# Patient Record
Sex: Female | Born: 1955 | Race: White | Hispanic: No | Marital: Married | Smoking: Never smoker
Health system: Southern US, Community
[De-identification: ages and names within clinical notes are randomized; demographics above are authoritative.]

## PROBLEM LIST (undated history)

## (undated) DIAGNOSIS — E785 Hyperlipidemia, unspecified: Secondary | ICD-10-CM

## (undated) DIAGNOSIS — I00 Rheumatic fever without heart involvement: Secondary | ICD-10-CM

## (undated) DIAGNOSIS — I471 Supraventricular tachycardia, unspecified: Secondary | ICD-10-CM

## (undated) DIAGNOSIS — R002 Palpitations: Secondary | ICD-10-CM

## (undated) DIAGNOSIS — F419 Anxiety disorder, unspecified: Secondary | ICD-10-CM

## (undated) DIAGNOSIS — I4891 Unspecified atrial fibrillation: Secondary | ICD-10-CM

## (undated) HISTORY — DX: Anxiety disorder, unspecified: F41.9

## (undated) HISTORY — DX: Rheumatic fever without heart involvement: I00

## (undated) HISTORY — PX: OTHER SURGICAL HISTORY: SHX169

## (undated) HISTORY — DX: Supraventricular tachycardia: I47.1

## (undated) HISTORY — DX: Supraventricular tachycardia, unspecified: I47.10

## (undated) HISTORY — DX: Unspecified atrial fibrillation: I48.91

## (undated) HISTORY — DX: Hyperlipidemia, unspecified: E78.5

## (undated) HISTORY — DX: Palpitations: R00.2

---

## 1998-11-21 ENCOUNTER — Encounter: Admission: RE | Admit: 1998-11-21 | Discharge: 1999-02-19 | Payer: Self-pay | Admitting: Anesthesiology

## 1999-01-07 ENCOUNTER — Ambulatory Visit (HOSPITAL_COMMUNITY): Admission: RE | Admit: 1999-01-07 | Discharge: 1999-01-07 | Payer: Self-pay | Admitting: Neurosurgery

## 1999-01-07 ENCOUNTER — Encounter: Payer: Self-pay | Admitting: Neurosurgery

## 1999-02-21 ENCOUNTER — Ambulatory Visit (HOSPITAL_COMMUNITY): Admission: RE | Admit: 1999-02-21 | Discharge: 1999-02-21 | Payer: Self-pay | Admitting: Neurosurgery

## 1999-02-21 ENCOUNTER — Encounter: Payer: Self-pay | Admitting: Neurosurgery

## 2001-01-13 ENCOUNTER — Other Ambulatory Visit: Admission: RE | Admit: 2001-01-13 | Discharge: 2001-01-13 | Payer: Self-pay | Admitting: Obstetrics and Gynecology

## 2002-01-13 ENCOUNTER — Other Ambulatory Visit: Admission: RE | Admit: 2002-01-13 | Discharge: 2002-01-13 | Payer: Self-pay | Admitting: Obstetrics and Gynecology

## 2003-02-02 ENCOUNTER — Other Ambulatory Visit: Admission: RE | Admit: 2003-02-02 | Discharge: 2003-02-02 | Payer: Self-pay | Admitting: Obstetrics and Gynecology

## 2004-03-16 ENCOUNTER — Other Ambulatory Visit: Admission: RE | Admit: 2004-03-16 | Discharge: 2004-03-16 | Payer: Self-pay | Admitting: Obstetrics and Gynecology

## 2005-04-04 ENCOUNTER — Ambulatory Visit: Payer: Self-pay | Admitting: Internal Medicine

## 2005-04-16 ENCOUNTER — Ambulatory Visit (HOSPITAL_COMMUNITY): Admission: RE | Admit: 2005-04-16 | Discharge: 2005-04-16 | Payer: Self-pay | Admitting: Internal Medicine

## 2005-04-23 ENCOUNTER — Ambulatory Visit: Payer: Self-pay | Admitting: Internal Medicine

## 2005-05-01 ENCOUNTER — Other Ambulatory Visit: Admission: RE | Admit: 2005-05-01 | Discharge: 2005-05-01 | Payer: Self-pay | Admitting: Obstetrics and Gynecology

## 2005-06-01 ENCOUNTER — Inpatient Hospital Stay (HOSPITAL_BASED_OUTPATIENT_CLINIC_OR_DEPARTMENT_OTHER): Admission: RE | Admit: 2005-06-01 | Discharge: 2005-06-01 | Payer: Self-pay | Admitting: Cardiology

## 2005-06-01 HISTORY — PX: CARDIAC CATHETERIZATION: SHX172

## 2005-06-06 ENCOUNTER — Ambulatory Visit (HOSPITAL_COMMUNITY): Admission: RE | Admit: 2005-06-06 | Discharge: 2005-06-06 | Payer: Self-pay | Admitting: Cardiology

## 2005-08-23 ENCOUNTER — Ambulatory Visit: Payer: Self-pay | Admitting: Internal Medicine

## 2006-02-06 ENCOUNTER — Ambulatory Visit: Payer: Self-pay | Admitting: Cardiology

## 2006-02-22 ENCOUNTER — Ambulatory Visit: Payer: Self-pay | Admitting: Internal Medicine

## 2006-02-28 ENCOUNTER — Ambulatory Visit (HOSPITAL_COMMUNITY): Admission: RE | Admit: 2006-02-28 | Discharge: 2006-02-28 | Payer: Self-pay | Admitting: Internal Medicine

## 2006-03-12 ENCOUNTER — Ambulatory Visit (HOSPITAL_COMMUNITY): Admission: RE | Admit: 2006-03-12 | Discharge: 2006-03-12 | Payer: Self-pay | Admitting: Internal Medicine

## 2006-03-12 ENCOUNTER — Encounter (INDEPENDENT_AMBULATORY_CARE_PROVIDER_SITE_OTHER): Payer: Self-pay | Admitting: *Deleted

## 2006-05-30 ENCOUNTER — Other Ambulatory Visit: Admission: RE | Admit: 2006-05-30 | Discharge: 2006-05-30 | Payer: Self-pay | Admitting: Family Medicine

## 2008-05-14 HISTORY — PX: US ECHOCARDIOGRAPHY: HXRAD669

## 2008-07-20 ENCOUNTER — Ambulatory Visit: Payer: Self-pay | Admitting: Internal Medicine

## 2008-07-20 ENCOUNTER — Inpatient Hospital Stay (HOSPITAL_COMMUNITY): Admission: AD | Admit: 2008-07-20 | Discharge: 2008-07-24 | Payer: Self-pay | Admitting: Cardiology

## 2008-07-21 ENCOUNTER — Encounter: Payer: Self-pay | Admitting: Internal Medicine

## 2008-11-11 ENCOUNTER — Encounter: Payer: Self-pay | Admitting: Internal Medicine

## 2008-11-12 DIAGNOSIS — J984 Other disorders of lung: Secondary | ICD-10-CM | POA: Insufficient documentation

## 2008-11-12 DIAGNOSIS — I080 Rheumatic disorders of both mitral and aortic valves: Secondary | ICD-10-CM | POA: Insufficient documentation

## 2008-11-12 DIAGNOSIS — F17200 Nicotine dependence, unspecified, uncomplicated: Secondary | ICD-10-CM | POA: Insufficient documentation

## 2008-11-12 DIAGNOSIS — S62109A Fracture of unspecified carpal bone, unspecified wrist, initial encounter for closed fracture: Secondary | ICD-10-CM | POA: Insufficient documentation

## 2008-11-12 DIAGNOSIS — F411 Generalized anxiety disorder: Secondary | ICD-10-CM | POA: Insufficient documentation

## 2008-11-12 DIAGNOSIS — E785 Hyperlipidemia, unspecified: Secondary | ICD-10-CM | POA: Insufficient documentation

## 2008-11-12 DIAGNOSIS — R252 Cramp and spasm: Secondary | ICD-10-CM | POA: Insufficient documentation

## 2008-11-16 ENCOUNTER — Ambulatory Visit: Payer: Self-pay | Admitting: Internal Medicine

## 2008-11-16 DIAGNOSIS — I959 Hypotension, unspecified: Secondary | ICD-10-CM | POA: Insufficient documentation

## 2008-11-24 ENCOUNTER — Encounter (INDEPENDENT_AMBULATORY_CARE_PROVIDER_SITE_OTHER): Payer: Self-pay | Admitting: *Deleted

## 2008-12-24 ENCOUNTER — Encounter: Payer: Self-pay | Admitting: Cardiology

## 2008-12-30 ENCOUNTER — Ambulatory Visit: Payer: Self-pay | Admitting: Internal Medicine

## 2009-02-22 ENCOUNTER — Encounter: Payer: Self-pay | Admitting: Internal Medicine

## 2009-02-22 ENCOUNTER — Ambulatory Visit: Payer: Self-pay | Admitting: Internal Medicine

## 2009-10-28 ENCOUNTER — Ambulatory Visit: Payer: Self-pay | Admitting: Cardiology

## 2010-04-06 NOTE — Letter (Signed)
Summary: [facility] Progress Note  [facility] Progress Note   Imported By: Roderic Ovens 04/13/2009 11:01:13  _____________________________________________________________________  External Attachment:    Type:   Image     Comment:   External Document

## 2010-06-13 LAB — CBC
HCT: 41.1 % (ref 36.0–46.0)
Hemoglobin: 14.3 g/dL (ref 12.0–15.0)
MCHC: 34.8 g/dL (ref 30.0–36.0)
MCV: 93.4 fL (ref 78.0–100.0)
Platelets: 201 10*3/uL (ref 150–400)
RBC: 4.4 MIL/uL (ref 3.87–5.11)
WBC: 4.6 10*3/uL (ref 4.0–10.5)

## 2010-06-13 LAB — COMPREHENSIVE METABOLIC PANEL
AST: 20 U/L (ref 0–37)
CO2: 28 mEq/L (ref 19–32)
Calcium: 9.6 mg/dL (ref 8.4–10.5)
Chloride: 106 mEq/L (ref 96–112)
GFR calc Af Amer: >60 mL/min (ref >60)
GFR calc non Af Amer: >60 mL/min (ref >60)
Potassium: 3.9 mEq/L (ref 3.5–5.1)
Total Bilirubin: 0.8 mg/dL (ref 0.3–1.2)

## 2010-06-13 LAB — PROTIME-INR: INR: 1 (ref 0.00–1.49)

## 2010-06-13 LAB — MAGNESIUM: Magnesium: 2.3 mg/dL (ref 1.5–2.5)

## 2010-06-13 LAB — APTT: aPTT: 30 seconds (ref 24–37)
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
## 2010-07-18 NOTE — Consult Note (Signed)
Beth Schmidt, LOOSE NO.:  [id number]   MEDICAL RECORD NO.:  [id number]          PATIENT TYPE:  INP   LOCATION:  2028                         FACILITY:  [facility]   PHYSICIAN:  Duke Salvia, MD, FACCDATE OF BIRTH:  1955-04-28   DATE OF CONSULTATION:  07/21/2008  DATE OF DISCHARGE:                                 CONSULTATION   Thank you much for asking [place] to see Beth Schmidt in consultation  because of tachycardia/atrial fibrillation.   HISTORY OF PRESENT ILLNESS:  Beth Schmidt is a 55 year old woman who has  a history going back now months of recurrent increasingly frequent and  symptomatic daily tachy palpitations.  These typically occur in a very  brief repeating runs lasting 15-20 seconds and then recurring again and  it will go on all day long.  They are associated with fatigue and  shortness of breath.  There is no clear aggravating effect by caffeine.  Beta-blocker therapy was tried but was intolerable because of  aggravation of a baseline low blood pressure.  She is now admitted for  initiation of flecainide therapy.   PAST CARDIAC HISTORY:  Notable for normal left ventricular function by  echo in March 2010 with mild mitral regurgitation.  She has a history of  rheumatic fever when she was a child.  She has a history of normal  catheterization in 2007.   PAST MEDICAL HISTORY:  In addition to above is notable for:  1. Hyperlipidemia.  2. Ongoing tobacco abuse.  3. Chronic leg cramps.  4. History of lung nodules.  5. Chronic anxiety.   PAST SURGICAL HISTORY:  1. Hysterectomy.  2. Knee arthroscopy bilaterally.   CURRENT MEDICATIONS:  1. Zetia 10.  2. Flecainide 50 b.i.d.   ALLERGIES:  She is allergic to MORPHINE and CODEINE associated with  itching.   FAMILY HISTORY:  Noncontributory.   REVIEW OF SYSTEMS:  Notable as above and is otherwise negative across  all organ systems apart from early bilateral cataracts and chronic cold  intolerance.   SOCIAL HISTORY:  She is married.  She continues to smoke, although she  is not in the last 24 hours.  She uses some alcohol.  Denies use of  recreational drugs.  She has one son who is getting married in the fall.   PHYSICAL EXAMINATION:  GENERAL:  She is a middle-to-older Caucasian  female appearing her stated age of 49.  She is lying flat in bed in no  acute distress.  VITAL SIGNS:  Her blood pressure was 92/64, her pulse was 68 and  regular, her respirations were 16 and unlabored.  HEENT:  No icterus or xanthoma.  NECK:  Her neck veins were flat.  Her carotids are brisk and full  bilaterally without bruits.  There is no lymphadenopathy.  There is no  thyromegaly.  BACK:  Without kyphosis or scoliosis.  LUNGS:  Clear.  HEART:  Sounds were regular with a loud S1.  There is no significant  murmurs.  ABDOMEN:  Soft with active bowel sounds without midline pulsation.  Femoral pulses were not examined.  Distal  pulses were intact.  There is  no clubbing, cyanosis, or edema.  NEUROLOGIC:  Grossly normal.  SKIN:  Warm and dry.   LABORATORY DATA:  Electrocardiogram dated Jul 20, 2008 demonstrated  sinus rhythm at 73 with intervals of 0.16/0.18/0.39.  Her intra-atrial  conduction time was a little bit long at about 120 milliseconds.  Review  of her telemetry strips are notable for episode #2 from April 29, 2008 described as location church and activity sleeping.  Sinus rhythm  is followed by PACs and then a run of about 30 seconds of a nonsustained  supraventricular tachycardia with what appears to be a P-wave described  in the distal ST-segment.  There is some irregularity of this.  The  tachycardia then terminates only to begin again about 30 seconds later,  again irregularly.  At this point, variable morphology P-waves are not  discernible in a couple of RR pauses and then it terminates with  restoration of sinus rhythm.   Other recordings include PVCs, PACs, and  atrial couplets.   IMPRESSION:  1. Nonsustained atrial tachycardia with some degree of irregularity,      increasing over recent months, occurring daily and associated with      fatigue and palpitations.  2. Low blood pressure aggravated by her beta-blocker therapy.  3. Flecainide therapy, now for nonsustained atrial tachycardia.  4. Dizziness, question related to flecainide therapy and low blood      pressure.  5. History of rheumatic fever with mild mitral regurgitation.  6. Chronic anxiety.   DISCUSSION:  [name] has recurrent episodes of nonsustained atrial  tachycardia.  These are frequently originating from the pulmonary veins  and may be a harbinger of atrial fibrillation.  They are quite  symptomatic.  Unfortunately, her low blood pressure makes therapy with  standard beta-blockers and her calcium blockers, difficult and it is a  reasonable thing to try flecainide.  One of the concerns I have with  flecainide is that if she were to develop atrial flutter, paradoxical  tachycardia could ensue.  Normally these drugs are used concomitantly  with AV nodal blocking agents; however, in this particular case that  will aggravate her hypotension.  An alternative would be to use  propafenone, as its inherent beta-blocker might keep that from  happening.  Clearly that is not a guarantee; furthermore, the beta-  blocker may also aggravate her hypotension.   The other issue is whether the dizziness that she is describing now is  related to her flecainide; and may well be the case.  It may also be  related to her blood pressure but that is more of a chronic underlying  condition, so I suspect that it is not true.   In the event that she is able to neither tolerate the drug nor to find  them effective, catheter ablation would be a reasonable consideration.  Dr. Johney Frame and others have experienced with pulmonary vein isolation and  I would recommend that as the presumed target in  choosing a strategy.   I have also suggested that she might be able to address some of her  chronic hypotension with use of salt and water repletion.  Some people  have used Florinef and/or ProAmatine and this can be associated with  symptomatic relief also from the chronic low blood pressure.   Thanks the consultation.      Duke Salvia, MD, [facility]  Electronically Signed     SCK/MEDQ  D:  07/21/2008  T:  07/22/2008  Job:  [id number]

## 2010-07-21 NOTE — Discharge Summary (Signed)
Beth Schmidt, Beth Schmidt              ACCOUNT NO.:  [id number]   MEDICAL RECORD NO.:  [id number]          PATIENT TYPE:  INP   LOCATION:  2028                         FACILITY:  [facility]   PHYSICIAN:  Colleen Can. Deborah Chalk, M.D.DATE OF BIRTH:  05-06-1955   DATE OF ADMISSION:  07/20/2008  DATE OF DISCHARGE:  07/24/2008                               DISCHARGE SUMMARY   DISCHARGE DIAGNOSES:  1. Palpitations with subsequent successful initiation of Rythmol.  2. Palpitations with documented supraventricular tachycardia as well      as atrial fibrillation.  3. Chronic anxiety.  4. Hyperlipidemia, maintained on Zetia.  5. Mild mitral regurgitation.  6. Ongoing tobacco abuse.   HISTORY OF PRESENT ILLNESS:  The patient is a 55 year old white female  who has had documented history of SVT as well as atrial fibrillation.  She is symptomatic and complains of palpitations.  She has had  associated lightheadedness and dizziness without any frank syncope.  She  was started on low-dose beta-blocker therapy which did improve her  symptoms, however, caused significant adverse reaction with feeling very  weak and washed out.  She was subsequently referred to start initially  on flecainide therapy.   Please see the history and physical for further patient presentation and  profile.   LABORATORY DATA:  TSH was normal at 1.2.  Magnesium level was normal at  2.3.  CBC was normal.  Chemistries were satisfactory except for a  glucose of 104, BUN was 10, creatinine 0.8, potassium was 3.9.  Her  chest x-ray showed chronic changes with no active disease.   HOSPITAL COURSE:  The patient was admitted.  She was initially started  on flecainide and unfortunately she did not tolerate this due to  significant dizziness and feeling lightheaded.  She was still  complaining of palpitations despite her rhythm being satisfactory on the  monitor.  Flecainide was subsequently discontinued.  The patient was  started on Rythmol  which she was able to tolerate without any problems.  She was watched on the monitor until Jul 24, 2008.  At that time, she  was feeling well.  Blood pressure was stable.  She was up and ambulatory  without problems.  Her EKG showed sinus bradycardia with a normal QT and  she was felt to be a satisfactory candidate for discharge.   DISCHARGE CONDITION:  Stable.   DISCHARGE DIET:  Low salt, heart healthy.   ACTIVITY:  Be up as tolerated.   SPECIAL INSTRUCTIONS:  She is strongly encouraged once again to stop  smoking.   DISCHARGE MEDICINES:  1. Zetia 10 mg a day.  2. Baby aspirin daily.  3. Rythmol SR 225 b.i.d.   We will plan on seeing her back in the office in approximately 1-2  weeks, certainly sooner if any problems would arise in the interim.   Her condition at discharge is improved.      Sharlee Blew, N.P.      Colleen Can. Deborah Chalk, M.D.  Electronically Signed    LC/MEDQ  D:  09/10/2008  T:  09/10/2008  Job:  [id number]

## 2010-07-21 NOTE — Cardiovascular Report (Signed)
Beth Schmidt, Beth Schmidt              ACCOUNT NO.:  [id number]   MEDICAL RECORD NO.:  [id number]          PATIENT TYPE:  OIB   LOCATION:  1967                         FACILITY:  [facility]   PHYSICIAN:  Colleen Can. Deborah Chalk, M.D.DATE OF BIRTH:  06/08/1955   DATE OF PROCEDURE:  06/01/2005  DATE OF DISCHARGE:  06/01/2005                              CARDIAC CATHETERIZATION   PROCEDURES:  Left heart catheterization with selective coronary angiography,  left ventricular angiography.   TYPE AND SITE OF ENTRY:  Percutaneous right femoral artery.   CATHETERS:  A 4-French #4 curved Judkins right and left coronary catheter, 4-  French pigtail ventriculographic catheter.   CONTRAST MATERIAL:  Omnipaque.   MEDICATIONS:  Given prior procedure, Valium 10 mg. Medications given during  the procedure, Versed 3 mg IV.   COMMENT:  The patient tolerated the procedure well.   HEMODYNAMIC DATA:  The aortic pressure is 99/59, LV was 99/0-5.  There was  no aortic valve gradient noted on pullback.   ANGIOGRAPHIC DATA:  Left ventricular angiogram performed in the RAO  position.  Overall cardiac size and silhouette were normal.  The global  ejection fraction was 60%.  Regional wall motion is normal.   CORONARY ARTERY:  Coronary arteries arise and distribute normally.   1.  Right coronary artery is a dominant vessel.  It is normal.  2.  Left main coronary artery is normal.  3.  Left anterior descending is a reasonably large vessel.  It essentially      bifurcates in the distal portion at the level of a second or perhaps      third diagonal vessel.  The two more proximal diagonals are relatively      small.  The left anterior descending is normal.  4.  Left circumflex continues as a reasonably large posterolateral branch.      It is normal.   OVERALL IMPRESSION:  1.  Normal left ventricular function.  2.  Normal coronary arteries.      Colleen Can. Deborah Chalk, M.D.  Electronically Signed     SNT/MEDQ  D:   06/01/2005  T:  06/03/2005  Job:  [id number]   cc:   Joni Fears D. Maple Hudson, M.D.  [facility] Dept  [address]  [place]  [place] [address]   Electa Sniff, M.D.   Richard M. Marcelle Overlie, M.D.  Fax: [phone]

## 2010-07-21 NOTE — Assessment & Plan Note (Signed)
[facility]                             PULMONARY OFFICE NOTE   ALMAROSA, BOHAC                     MRN:          [id number]  DATE:02/22/2006                            DOB:          21-Mar-1955    PROBLEMS:  1. 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  2. Lung nodules.  3. History of rheumatic fever with cardiac murmur.   HISTORY:  She says after being free of her subxyphoid pain for awhile it  returned recently, comes and go, and she asks if anxiety could do it.  Her dentist tells her that she is doing a lot of bruxism. We discussed  stress as a possible cause of esophageal spasm, or esophageal reflux.  Overall, she is having less pain than she did a year ago. She asks to  try Xanax and then she indicates she has taken up to 2 of 0.5 mg tablets  in the past without effect. She blames job stress and asks if I would  let her try a 1 mg Xanax tablet. I discussed the consideration of Librax  as an alternative, but she was not familiar with it and therefore, did  not want to try it. She does not have a primary physician. We discussed  again the result of her CAT scan of chest, abdomen, and pelvis done in  April. This had shown a nodule in the right upper lobe. CAT scan of the  abdomen had shown an enlarged liver and 2 poorly defined left renal  lesions, possibly cysts. A followup chest CAT scan on 02/06/2006 showed  the right upper lobe nodule to be stable with followup in 1 year  suggested. It is measuring 8 x 9 mm and is non-calcified. The left renal  lesion was not included on the study.   MEDICATIONS:  1. Estratest.  2. Zetia.  3. Naprosyn.  4. Darvocet.  She has had some Vicodin. She is drug intolerant of CODEINE or MORPHINE,  but she can take Vicodin.   OBJECTIVE:  Weight 177 pounds, blood pressure 112/78, pulse regular 74,  room air saturation 99%. She is alert. She tends to sit rather slumped  over, carrying her weight on her sternum. Breathing  is unlabored. There  is no obvious adenopathy or edema. Pulse is regular.   IMPRESSION:  1. She has had a cardiac workup with Dr. Deborah Chalk with impression that      the subxyphoid pain is probably not cardiac. I think esophageal      spasm would be a real possibility. That also implies some merit for      her suggestion that her pain is probably a stress phenomena.  2. Right upper lobe nodule, apparently stable and hopefully benign.  3. Left renal cysts. There is not really another physician involved      with her care to take over evaluation of this, so we have agreed to      get a CAT scan of the abdomen for comparison with the April study.   PLAN:  1. Alprazolam 1 mg x50 1 b.i.d. p.r.n. with discussion. Consider  Librax as an alternative.  2. CAT scan of abdomen with contrast for evaluation of left renal      lesions.  3. Schedule return to me in 2 months, earlier p.r.n.     Clinton D. Maple Hudson, MD, Tonny Bollman, FACP  Electronically Signed    CDY/MedQ  DD: 02/22/2006  DT: 02/24/2006  Job #: [id number]   cc:   Colleen Can. Deborah Chalk, M.D.
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
## 2011-07-19 ENCOUNTER — Encounter: Payer: Self-pay | Admitting: *Deleted

## 2013-02-24 ENCOUNTER — Ambulatory Visit (INDEPENDENT_AMBULATORY_CARE_PROVIDER_SITE_OTHER): Payer: BC Managed Care – PPO | Admitting: Family Medicine

## 2013-02-24 VITALS — BP 124/76 | HR 96 | Temp 98.6°F | Resp 17 | Ht 69.0 in | Wt 168.0 lb

## 2013-02-24 DIAGNOSIS — R059 Cough, unspecified: Secondary | ICD-10-CM

## 2013-02-24 DIAGNOSIS — R05 Cough: Secondary | ICD-10-CM

## 2013-02-24 DIAGNOSIS — J209 Acute bronchitis, unspecified: Secondary | ICD-10-CM

## 2013-02-24 LAB — POCT INFLUENZA A/B
Influenza A, POC: NEGATIVE
Influenza B, POC: NEGATIVE

## 2013-02-24 MED ORDER — AZITHROMYCIN 250 MG PO TABS: ORAL_TABLET | ORAL | Status: DC

## 2013-02-24 MED ORDER — HYDROCODONE-HOMATROPINE 5-1.5 MG/5ML PO SYRP: 5.0000 mL | ORAL_SOLUTION | Freq: Three times a day (TID) | ORAL | Status: DC | PRN

## 2013-02-24 NOTE — Progress Notes (Signed)
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  Feels bad all over.  Taking Advil currently  Obj: NAD Skin: flushed cheeks HEENT:  Mild pharyngeal erythema without exudates, TM's normal Chest:  Very congested cough Heart:  Reg, no murmur  Assessment:  Bronchitis  Cough - Plan: POCT Influenza A/B, HYDROcodone-homatropine (HYCODAN) 5-1.5 MG/5ML syrup, azithromycin (ZITHROMAX Z-PAK) 250 MG tablet  Signed, Elvina Sidle, MD

## 2013-02-24 NOTE — Patient Instructions (Signed)

## 2016-01-17 ENCOUNTER — Ambulatory Visit (INDEPENDENT_AMBULATORY_CARE_PROVIDER_SITE_OTHER): Payer: BLUE CROSS/BLUE SHIELD | Admitting: Urgent Care

## 2016-01-17 VITALS — BP 140/68 | HR 74 | Temp 98.3°F | Resp 16 | Ht 69.0 in | Wt 168.4 lb

## 2016-01-17 DIAGNOSIS — R490 Dysphonia: Secondary | ICD-10-CM | POA: Diagnosis not present

## 2016-01-17 DIAGNOSIS — R05 Cough: Secondary | ICD-10-CM | POA: Diagnosis not present

## 2016-01-17 DIAGNOSIS — J0140 Acute pansinusitis, unspecified: Secondary | ICD-10-CM | POA: Diagnosis not present

## 2016-01-17 DIAGNOSIS — Z8679 Personal history of other diseases of the circulatory system: Secondary | ICD-10-CM

## 2016-01-17 DIAGNOSIS — J3489 Other specified disorders of nose and nasal sinuses: Secondary | ICD-10-CM

## 2016-01-17 DIAGNOSIS — R059 Cough, unspecified: Secondary | ICD-10-CM

## 2016-01-17 LAB — POCT RAPID STREP A (OFFICE): Rapid Strep A Screen: NEGATIVE

## 2016-01-17 MED ORDER — AMOXICILLIN 875 MG PO TABS: 875.0000 mg | ORAL_TABLET | Freq: Two times a day (BID) | ORAL | 0 refills | Status: DC

## 2016-01-17 NOTE — Patient Instructions (Addendum)
Sinusitis, Adult Sinusitis is soreness and inflammation of your sinuses. Sinuses are hollow spaces in the bones around your face. Your sinuses are located:  Around your eyes.  In the middle of your forehead.  Behind your nose.  In your cheekbones. Your sinuses and nasal passages are lined with a stringy fluid (mucus). Mucus normally drains out of your sinuses. When your nasal tissues become inflamed or swollen, the mucus can become trapped or blocked so air cannot flow through your sinuses. This allows bacteria, viruses, and funguses to grow, which leads to infection. Sinusitis can develop quickly and last for 7?10 days (acute) or for more than 12 weeks (chronic). Sinusitis often develops after a cold. What are the causes? This condition is caused by anything that creates swelling in the sinuses or stops mucus from draining, including:  Allergies.  Asthma.  Bacterial or viral infection.  Abnormally shaped bones between the nasal passages.  Nasal growths that contain mucus (nasal polyps).  Narrow sinus openings.  Pollutants, such as chemicals or irritants in the air.  A foreign object stuck in the nose.  A fungal infection. This is rare. What increases the risk? The following factors may make you more likely to develop this condition:  Having allergies or asthma.  Having had a recent cold or respiratory tract infection.  Having structural deformities or blockages in your nose or sinuses.  Having a weak immune system.  Doing a lot of swimming or diving.  Overusing nasal sprays.  Smoking. What are the signs or symptoms? The main symptoms of this condition are pain and a feeling of pressure around the affected sinuses. Other symptoms include:  Upper toothache.  Earache.  Headache.  Bad breath.  Decreased sense of smell and taste.  A cough that may get worse at night.  Fatigue.  Fever.  Thick drainage from your nose. The drainage is often green and it may  contain pus (purulent).  Stuffy nose or congestion.  Postnasal drip. This is when extra mucus collects in the throat or back of the nose.  Swelling and warmth over the affected sinuses.  Sore throat.  Sensitivity to light. How is this diagnosed? This condition is diagnosed based on symptoms, a medical history, and a physical exam. To find out if your condition is acute or chronic, your health care provider may:  Look in your nose for signs of nasal polyps.  Tap over the affected sinus to check for signs of infection.  View the inside of your sinuses using an imaging device that has a light attached (endoscope). If your health care provider suspects that you have chronic sinusitis, you may also:  Be tested for allergies.  Have a sample of mucus taken from your nose (nasal culture) and checked for bacteria.  Have a mucus sample examined to see if your sinusitis is related to an allergy. If your sinusitis does not respond to treatment and it lasts longer than 8 weeks, you may have an MRI or CT scan to check your sinuses. These scans also help to determine how severe your infection is. In rare cases, a bone biopsy may be done to rule out more serious types of fungal sinus disease. How is this treated? Treatment for sinusitis depends on the cause and whether your condition is chronic or acute. If a virus is causing your sinusitis, your symptoms will go away on their own within 10 days. You may be given medicines to relieve your symptoms, including:  Topical nasal decongestants. They   shrink swollen nasal passages and let mucus drain from your sinuses.  Antihistamines. These drugs block inflammation that is triggered by allergies. This can help to ease swelling in your nose and sinuses.  Topical nasal corticosteroids. These are nasal sprays that ease inflammation and swelling in your nose and sinuses.  Nasal saline washes. These rinses can help to get rid of thick mucus in your  nose. If your condition is caused by bacteria, you will be given an antibiotic medicine. If your condition is caused by a fungus, you will be given an antifungal medicine. Surgery may be needed to correct underlying conditions, such as narrow nasal passages. Surgery may also be needed to remove polyps. Follow these instructions at home: Medicines  Take, use, or apply over-the-counter and prescription medicines only as told by your health care provider. These may include nasal sprays.  If you were prescribed an antibiotic medicine, take it as told by your health care provider. Do not stop taking the antibiotic even if you start to feel better. Hydrate and Humidify  Drink enough water to keep your urine clear or pale yellow. Staying hydrated will help to thin your mucus.  Use a cool mist humidifier to keep the humidity level in your home above 50%.  Inhale steam for 10-15 minutes, 3-4 times a day or as told by your health care provider. You can do this in the bathroom while a hot shower is running.  Limit your exposure to cool or dry air. Rest  Rest as much as possible.  Sleep with your head raised (elevated).  Make sure to get enough sleep each night. General instructions  Apply a warm, moist washcloth to your face 3-4 times a day or as told by your health care provider. This will help with discomfort.  Wash your hands often with soap and water to reduce your exposure to viruses and other germs. If soap and water are not available, use hand sanitizer.  Do not smoke. Avoid being around people who are smoking (secondhand smoke).  Keep all follow-up visits as told by your health care provider. This is important. Contact a health care provider if:  You have a fever.  Your symptoms get worse.  Your symptoms do not improve within 10 days. Get help right away if:  You have a severe headache.  You have persistent vomiting.  You have pain or swelling around your face or  eyes.  You have vision problems.  You develop confusion.  Your neck is stiff.  You have trouble breathing. This information is not intended to replace advice given to you by your health care provider. Make sure you discuss any questions you have with your health care provider. Document Released: 02/19/2005 Document Revised: 10/16/2015 Document Reviewed: 12/15/2014 Elsevier Interactive Patient Education  2017 [employer].     IF you received an x-ray today, you will receive an invoice from [facility]. Please contact [facility] at [phone] with questions or concerns regarding your invoice.   IF you received labwork today, you will receive an invoice from [employer]. Please contact [facility] at [phone] with questions or concerns regarding your invoice.   Our billing staff will not be able to assist you with questions regarding bills from these companies.  You will be contacted with the lab results as soon as they are available. The fastest way to get your results is to activate your My Chart account. Instructions are located on the last page of this paperwork. If  you have not heard from [place] regarding the results in 2 weeks, please contact this office.

## 2016-01-17 NOTE — Addendum Note (Signed)
Addended by: Jannette Spanner on: 01/17/2016 01:40 PM   Modules accepted: Orders

## 2016-01-17 NOTE — Progress Notes (Signed)
    MRN: [id number] DOB: 03-18-1955  Subjective:   Beth Schmidt is a 60 y.o. female presenting for chief complaint of URI (chest/head/sinus congestion, yellow mucus, headache )  Reports 1 week history of sinus congestion, sinus pain, productive cough, chest congestion. Cough has caused chest discomfort. Has tried an otc sinus medication with temporary relief. Has had sick contacts with strep and sinusitis. Denies fever, shob, wheezing, n/v, abdominal pain, rashes, myalgia. Denies smoking cigarettes or drinking alcohol. Denies history of asthma, allergies. Plans on leaving to [place] on 02/16/2016.  Beth Schmidt has a current medication list which includes the following prescription(s): aspirin, azithromycin, and hydrocodone-homatropine. Also is allergic to codeine and morphine sulfate.  Beth Schmidt  has a past medical history of Anxiety; Atrial fibrillation; Hyperlipidemia; Palpitations; and SVT (supraventricular tachycardia) ([place]). Also  has a past surgical history that includes Cardiac catheterization (06/01/2005) and US ECHOCARDIOGRAPHY (05/14/2008).   Objective:   Vitals: BP 140/68 (BP Location: Right Arm, Patient Position: Sitting, Cuff Size: Small)   Pulse 74   Temp 98.3 F (36.8 C) (Oral)   Resp 16   Ht 5\' 9"  (1.753 m)   Wt 168 lb 6.4 oz (76.4 kg)   SpO2 98%   BMI 24.87 kg/m   Physical Exam  Constitutional: She is oriented to person, place, and time. She appears well-developed and well-nourished.  HENT:  TM's intact bilaterally, no effusions or erythema. Nasal turbinates pink and moist, nasal passages patent. Bilateral maxillary and frontal sinus tenderness. Oropharynx clear, mucous membranes moist, dentition in good repair.  Eyes: Right eye exhibits no discharge. Left eye exhibits no discharge. No scleral icterus.  Neck: Normal range of motion. Neck supple.  Cardiovascular: Normal rate, regular rhythm and intact distal pulses.  Exam reveals no gallop and no friction rub.   No  murmur heard. Pulmonary/Chest: No respiratory distress. She has no wheezes. She has no rales.  Lymphadenopathy:    She has no cervical adenopathy.  Neurological: She is alert and oriented to person, place, and time.  Skin: Skin is warm and dry.   Results for orders placed or performed in visit on 01/17/16 (from the past 24 hour(s))  POCT rapid strep A     Status: None   Collection Time: 01/17/16 10:59 AM  Result Value Ref Range   Rapid Strep A Screen Negative Negative   Assessment and Plan :   1. Acute non-recurrent pansinusitis 2. Sinus pain 3. Cough 4. Hoarseness of voice 5. History of rheumatic fever - Start amoxicillin for 7 days to address bacterial sinusitis. Supportive care otherwise, rtc in 1 week if symptoms persist.  Jaynee Eagles, PA-C [facility] [phone] 01/17/2016 10:36 AM

## 2016-01-19 ENCOUNTER — Encounter: Payer: Self-pay | Admitting: Urgent Care

## 2016-01-19 LAB — CULTURE, GROUP A STREP: Organism ID, Bacteria: NORMAL

## 2016-04-19 ENCOUNTER — Ambulatory Visit (INDEPENDENT_AMBULATORY_CARE_PROVIDER_SITE_OTHER): Payer: BLUE CROSS/BLUE SHIELD | Admitting: Emergency Medicine

## 2016-04-19 VITALS — BP 116/80 | HR 78 | Temp 98.5°F | Resp 16 | Ht 68.5 in | Wt 166.4 lb

## 2016-04-19 DIAGNOSIS — J111 Influenza due to unidentified influenza virus with other respiratory manifestations: Secondary | ICD-10-CM | POA: Diagnosis not present

## 2016-04-19 DIAGNOSIS — R071 Chest pain on breathing: Secondary | ICD-10-CM

## 2016-04-19 DIAGNOSIS — R059 Cough, unspecified: Secondary | ICD-10-CM

## 2016-04-19 DIAGNOSIS — R05 Cough: Secondary | ICD-10-CM | POA: Diagnosis not present

## 2016-04-19 MED ORDER — OSELTAMIVIR PHOSPHATE 75 MG PO CAPS: 75.0000 mg | ORAL_CAPSULE | Freq: Two times a day (BID) | ORAL | 0 refills | Status: AC

## 2016-04-19 MED ORDER — PROMETHAZINE-DM 6.25-15 MG/5ML PO SYRP: 5.0000 mL | ORAL_SOLUTION | Freq: Four times a day (QID) | ORAL | 0 refills | Status: DC | PRN

## 2016-04-19 MED ORDER — OSELTAMIVIR PHOSPHATE 75 MG PO CAPS: 75.0000 mg | ORAL_CAPSULE | Freq: Two times a day (BID) | ORAL | 0 refills | Status: DC

## 2016-04-19 NOTE — Patient Instructions (Addendum)
     IF you received an x-ray today, you will receive an invoice from [facility]. Please contact [facility] at [phone] with questions or concerns regarding your invoice.   IF you received labwork today, you will receive an invoice from [facility]. Please contact [facility] at [phone] with questions or concerns regarding your invoice.   Our billing staff will not be able to assist you with questions regarding bills from these companies.  You will be contacted with the lab results as soon as they are available. The fastest way to get your results is to activate your My Chart account. Instructions are located on the last page of this paperwork. If you have not heard from us regarding the results in 2 weeks, please contact this office.      Influenza, Adult Influenza ("the flu") is an infection in the lungs, nose, and throat (respiratory tract). It is caused by a virus. The flu causes many common cold symptoms, as well as a high fever and body aches. It can make you feel very sick. The flu spreads easily from person to person (is contagious). Getting a flu shot (influenza vaccination) every year is the best way to prevent the flu. Follow these instructions at home:  Take over-the-counter and prescription medicines only as told by your doctor.  Use a cool mist humidifier to add moisture (humidity) to the air in your home. This can make it easier to breathe.  Rest as needed.  Drink enough fluid to keep your pee (urine) clear or pale yellow.  Cover your mouth and nose when you cough or sneeze.  Wash your hands with soap and water often, especially after you cough or sneeze. If you cannot use soap and water, use hand sanitizer.  Stay home from work or school as told by your doctor. Unless you are visiting your doctor, try to avoid leaving home until your fever has been gone for 24 hours without the use of medicine.  Keep all follow-up visits as told by your doctor.  This is important. How is this prevented?  Getting a yearly (annual) flu shot is the best way to avoid getting the flu. You may get the flu shot in late summer, fall, or winter. Ask your doctor when you should get your flu shot.  Wash your hands often or use hand sanitizer often.  Avoid contact with people who are sick during cold and flu season.  Eat healthy foods.  Drink plenty of fluids.  Get enough sleep.  Exercise regularly. Contact a doctor if:  You get new symptoms.  You have:  Chest pain.  Watery poop (diarrhea).  A fever.  Your cough gets worse.  You start to have more mucus.  You feel sick to your stomach (nauseous).  You throw up (vomit). Get help right away if:  You start to be short of breath or have trouble breathing.  Your skin or nails turn a bluish color.  You have very bad pain or stiffness in your neck.  You get a sudden headache.  You get sudden pain in your face or ear.  You cannot stop throwing up. This information is not intended to replace advice given to you by your health care provider. Make sure you discuss any questions you have with your health care provider. Document Released: 11/29/2007 Document Revised: 07/28/2015 Document Reviewed: 12/14/2014 Elsevier Interactive Patient Education  2017 [employer] Inc.  

## 2016-04-19 NOTE — Addendum Note (Signed)
Addended by: Candie Chroman D on: 04/19/2016 04:47 PM   Modules accepted: Orders

## 2016-04-19 NOTE — Progress Notes (Signed)
Beth Schmidt 61 y.o.   Chief Complaint  Patient presents with  . Cough    WITH RUNNY NOSE x 1 day  . Fatigue    HISTORY OF PRESENT ILLNESS: This is a 61 y.o. female complaining of cough, runny nose, fatigue and chest pain when coughing that started yesterday. Exposed to several people sick with the flu including grandchildren.  Influenza  This is a new problem. The current episode started yesterday. The problem occurs constantly. The problem has been rapidly worsening. Associated symptoms include chest pain, chills, congestion, coughing, fatigue, headaches, myalgias, a sore throat and weakness. Pertinent negatives include no abdominal pain, arthralgias, fever, nausea, neck pain, rash, urinary symptoms, vertigo or vomiting. Nothing aggravates the symptoms. She has tried nothing for the symptoms.     Prior to Admission medications   Medication Sig Start Date End Date Taking? Authorizing Provider  amoxicillin (AMOXIL) 875 MG tablet Take 1 tablet (875 mg total) by mouth 2 (two) times daily. Patient not taking: Reported on 04/19/2016 01/17/16   Jaynee Eagles, PA-C    Allergies  Allergen Reactions  . Codeine   . Morphine Sulfate     Patient Active Problem List   Diagnosis Date Noted  . HYPOTENSION 11/16/2008  . HYPERLIPIDEMIA 11/12/2008  . ANXIETY, CHRONIC 11/12/2008  . TOBACCO ABUSE 11/12/2008  . MITRAL REGURGITATION, MILD 11/12/2008  . LUNG NODULE 11/12/2008  . LEG CRAMPS 11/12/2008  . FRACTURE, WRIST, LEFT 11/12/2008    Past Medical History:  Diagnosis Date  . Anxiety   . Atrial fibrillation   . Hyperlipidemia   . Palpitations   . SVT (supraventricular tachycardia) ([facility])     Past Surgical History:  Procedure Laterality Date  . CARDIAC CATHETERIZATION  06/01/2005   EF 60%  . US ECHOCARDIOGRAPHY  05/14/2008   EF 55-60%    Social History   Social History  . Marital status: Married    Spouse name: N/A  . Number of children: N/A  . Years of education: N/A    Occupational History  . Not on file.   Social History Main Topics  . Smoking status: Never Smoker  . Smokeless tobacco: Never Used  . Alcohol use No  . Drug use: No  . Sexual activity: Not on file   Other Topics Concern  . Not on file   Social History Narrative  . No narrative on file    Family History  Problem Relation Age of Onset  . Cardiomyopathy Mother   . Coronary artery disease Father      Review of Systems  Constitutional: Positive for chills, fatigue and malaise/fatigue. Negative for fever.  HENT: Positive for congestion and sore throat. Negative for ear pain, nosebleeds and sinus pain.   Eyes: Negative for discharge and redness.  Respiratory: Positive for cough. Negative for hemoptysis, shortness of breath and wheezing.   Cardiovascular: Positive for chest pain. Negative for palpitations and leg swelling.  Gastrointestinal: Negative for abdominal pain, diarrhea, nausea and vomiting.  Genitourinary: Negative for dysuria and hematuria.  Musculoskeletal: Positive for myalgias. Negative for arthralgias and neck pain.  Skin: Negative for rash.  Neurological: Positive for weakness and headaches. Negative for dizziness, vertigo, sensory change and focal weakness.  All other systems reviewed and are negative.  Vitals:   04/19/16 0956  BP: 116/80  Pulse: 78  Resp: 16  Temp: 98.5 F (36.9 C)     Physical Exam  Constitutional: She is oriented to person, place, and time. She appears well-developed and well-nourished.  HENT:  Head: Normocephalic and atraumatic.  Right Ear: External ear normal.  Left Ear: External ear normal.  Nose: Nose normal.  Mouth/Throat: Oropharynx is clear and moist. No oropharyngeal exudate.  Eyes: Conjunctivae and EOM are normal. Pupils are equal, round, and reactive to light.  Neck: Normal range of motion. Neck supple. No JVD present. No thyromegaly present.  Cardiovascular: Normal rate, regular rhythm, normal heart sounds and intact  distal pulses.   Pulmonary/Chest: Effort normal and breath sounds normal. She has no wheezes. She has no rales.  Abdominal: Soft. She exhibits no distension. There is no tenderness.  Musculoskeletal: Normal range of motion.  Lymphadenopathy:    She has no cervical adenopathy.  Neurological: She is alert and oriented to person, place, and time. No sensory deficit. She exhibits normal muscle tone.  Skin: Skin is warm and dry. Capillary refill takes less than 2 seconds.  Psychiatric: She has a normal mood and affect. Her behavior is normal.  Vitals reviewed.  EKG: NSR. No acute ischemic changes.  ASSESSMENT & PLAN: [name] was seen today for cough and fatigue.  Diagnoses and all orders for this visit:  Influenza with respiratory manifestation other than pneumonia  Chest pain on breathing -     EKG 12-Lead  Cough  Other orders -     oseltamivir (TAMIFLU) 75 MG capsule; Take 1 capsule (75 mg total) by mouth 2 (two) times daily. -     promethazine-dextromethorphan (PROMETHAZINE-DM) 6.25-15 MG/5ML syrup; Take 5 mLs by mouth 4 (four) times daily as needed for cough.      Agustina Caroli, MD [facility]

## 2016-04-20 ENCOUNTER — Telehealth: Payer: Self-pay

## 2016-04-20 NOTE — Telephone Encounter (Signed)
Pt has been diagnosed with the flu on 04/19/16 and is now experiencing sinus pressure headache and fever and would like to know what she could take  Best number [phone]

## 2016-04-20 NOTE — Telephone Encounter (Signed)
tamiflu and cough med given, would you suggest anything else? otc decongestant , nasal saline or rx?

## 2016-04-21 NOTE — Telephone Encounter (Signed)
Pt advised for fever, headache and sinus pain may take advil 600mg  q 6-8 hours and nasal saline See [place] if sob or any worsening sxs

## 2016-04-24 ENCOUNTER — Telehealth: Payer: Self-pay

## 2016-04-24 NOTE — Telephone Encounter (Signed)
She has no fever.  She has sinus pressure for three days.  No sob or dyspnea.  She has been taking advil.  No easy fatigue or winded.  Cough is non-productive.   mucinex 1200 mg every 12 hours and flonase over the counter.   Advised alarming symptoms to warrant a more immediate return.  She voiced understanding.

## 2016-04-24 NOTE — Telephone Encounter (Signed)
Pt has taken all the meds for the flu but still has chest congestion and would like something called in for it   Best number [phone]

## 2016-09-03 ENCOUNTER — Ambulatory Visit (INDEPENDENT_AMBULATORY_CARE_PROVIDER_SITE_OTHER): Payer: BLUE CROSS/BLUE SHIELD | Admitting: Emergency Medicine

## 2016-09-03 ENCOUNTER — Encounter: Payer: Self-pay | Admitting: Emergency Medicine

## 2016-09-03 VITALS — BP 130/66 | HR 65 | Temp 98.8°F | Resp 16 | Ht 68.5 in | Wt 140.0 lb

## 2016-09-03 DIAGNOSIS — J209 Acute bronchitis, unspecified: Secondary | ICD-10-CM | POA: Diagnosis not present

## 2016-09-03 DIAGNOSIS — R059 Cough, unspecified: Secondary | ICD-10-CM

## 2016-09-03 DIAGNOSIS — R05 Cough: Secondary | ICD-10-CM | POA: Diagnosis not present

## 2016-09-03 MED ORDER — PREDNISONE 20 MG PO TABS: 40.0000 mg | ORAL_TABLET | Freq: Every day | ORAL | 0 refills | Status: AC

## 2016-09-03 MED ORDER — PROMETHAZINE-CODEINE 6.25-10 MG/5ML PO SYRP: 5.0000 mL | ORAL_SOLUTION | Freq: Every evening | ORAL | 0 refills | Status: DC | PRN

## 2016-09-03 MED ORDER — AZITHROMYCIN 250 MG PO TABS: ORAL_TABLET | ORAL | 0 refills | Status: DC

## 2016-09-03 NOTE — Progress Notes (Signed)
Beth Schmidt 61 y.o.   Chief Complaint  Patient presents with  . Cough    NONPRODUCTIVE  . CHEST CONGESTION    HISTORY OF PRESENT ILLNESS: This is a 61 y.o. female complaining of non-productive cough x 2-3 weeks.  Cough  This is a new problem. The current episode started 1 to 4 weeks ago. The problem has been gradually worsening. The problem occurs constantly. The cough is non-productive. Associated symptoms include headaches and wheezing. Pertinent negatives include no chest pain, chills, eye redness, fever, heartburn, hemoptysis, nasal congestion, rash, sore throat, shortness of breath or weight loss. Treatments tried: Mucinex-DM. The treatment provided no relief.     Prior to Admission medications   Medication Sig Start Date End Date Taking? Authorizing Provider  amoxicillin (AMOXIL) 875 MG tablet Take 1 tablet (875 mg total) by mouth 2 (two) times daily. Patient not taking: Reported on 04/19/2016 01/17/16   Beth Eagles, PA-C  promethazine-dextromethorphan (PROMETHAZINE-DM) 6.25-15 MG/5ML syrup Take 5 mLs by mouth 4 (four) times daily as needed for cough. Patient not taking: Reported on 09/03/2016 04/19/16   Beth Pollen, MD    Allergies  Allergen Reactions  . Codeine   . Morphine Sulfate     Patient Active Problem List   Diagnosis Date Noted  . Influenza with respiratory manifestation other than pneumonia 04/19/2016  . HYPOTENSION 11/16/2008  . HYPERLIPIDEMIA 11/12/2008  . ANXIETY, CHRONIC 11/12/2008  . TOBACCO ABUSE 11/12/2008  . MITRAL REGURGITATION, MILD 11/12/2008  . LUNG NODULE 11/12/2008  . LEG CRAMPS 11/12/2008  . FRACTURE, WRIST, LEFT 11/12/2008    Past Medical History:  Diagnosis Date  . Anxiety   . Atrial fibrillation   . Hyperlipidemia   . Palpitations   . Rheumatic fever   . SVT (supraventricular tachycardia) (HCC)     Past Surgical History:  Procedure Laterality Date  . CARDIAC CATHETERIZATION  06/01/2005   EF 60%  . US  ECHOCARDIOGRAPHY  05/14/2008   EF 55-60%    Social History   Social History  . Marital status: Married    Spouse name: N/A  . Number of children: N/A  . Years of education: N/A   Occupational History  . Not on file.   Social History Main Topics  . Smoking status: Never Smoker  . Smokeless tobacco: Never Used  . Alcohol use No  . Drug use: No  . Sexual activity: Not on file   Other Topics Concern  . Not on file   Social History Narrative  . No narrative on file    Family History  Problem Relation Age of Onset  . Cardiomyopathy Mother   . Coronary artery disease Father      Review of Systems  Constitutional: Negative for chills, fever and weight loss.  HENT: Negative.  Negative for congestion, nosebleeds and sore throat.   Eyes: Negative.  Negative for discharge and redness.  Respiratory: Positive for cough and wheezing. Negative for hemoptysis, sputum production and shortness of breath.   Cardiovascular: Negative for chest pain, palpitations and leg swelling.  Gastrointestinal: Negative for abdominal pain, diarrhea, heartburn, nausea and vomiting.  Genitourinary: Negative for dysuria, flank pain and hematuria.  Musculoskeletal: Negative.   Skin: Negative for rash.  Neurological: Positive for headaches.  Endo/Heme/Allergies: Negative.   All other systems reviewed and are negative.  Vitals:   09/03/16 1328  BP: 130/66  Pulse: 65  Resp: 16  Temp: 98.8 F (37.1 C)     Physical Exam  Constitutional: She  is oriented to person, place, and time. She appears well-developed and well-nourished.  HENT:  Head: Normocephalic and atraumatic.  Nose: Nose normal.  Mouth/Throat: Oropharynx is clear and moist. No oropharyngeal exudate.  Eyes: Conjunctivae and EOM are normal. Pupils are equal, round, and reactive to light.  Neck: Normal range of motion. Neck supple. No JVD present. No thyromegaly present.  Cardiovascular: Normal rate, regular rhythm, normal heart sounds  and intact distal pulses.   Pulmonary/Chest: Effort normal and breath sounds normal.  Abdominal: Soft. Bowel sounds are normal. There is no tenderness.  Musculoskeletal: Normal range of motion.  Lymphadenopathy:    She has no cervical adenopathy.  Neurological: She is alert and oriented to person, place, and time. No sensory deficit. She exhibits normal muscle tone.  Skin: Skin is warm and dry. No rash noted.  Psychiatric: She has a normal mood and affect. Her behavior is normal.  Vitals reviewed.    ASSESSMENT & PLAN: Beth Schmidt was seen today for cough and chest congestion.  Diagnoses and all orders for this visit:  Acute bronchitis, unspecified organism  Cough  Other orders -     promethazine-codeine (PHENERGAN WITH CODEINE) 6.25-10 MG/5ML syrup; Take 5 mLs by mouth at bedtime as needed for cough. -     predniSONE (DELTASONE) 20 MG tablet; Take 2 tablets (40 mg total) by mouth daily with breakfast. -     azithromycin (ZITHROMAX) 250 MG tablet; Sig as indicated    Patient Instructions       IF you received an x-ray today, you will receive an invoice from [facility]. Please contact [facility] at [phone] with questions or concerns regarding your invoice.   IF you received labwork today, you will receive an invoice from [place]. Please contact [facility] at [phone] with questions or concerns regarding your invoice.   Our billing staff will not be able to assist you with questions regarding bills from these companies.  You will be contacted with the lab results as soon as they are available. The fastest way to get your results is to activate your My Chart account. Instructions are located on the last page of this paperwork. If you have not heard from [place] regarding the results in 2 weeks, please contact this office.     Acute Bronchitis, Adult Acute bronchitis is when air tubes (bronchi) in the lungs suddenly get swollen. The condition can make it  hard to breathe. It can also cause these symptoms:  A cough.  Coughing up clear, yellow, or green mucus.  Wheezing.  Chest congestion.  Shortness of breath.  A fever.  Body aches.  Chills.  A sore throat.  Follow these instructions at home: Medicines  Take over-the-counter and prescription medicines only as told by your doctor.  If you were prescribed an antibiotic medicine, take it as told by your doctor. Do not stop taking the antibiotic even if you start to feel better. General instructions  Rest.  Drink enough fluids to keep your pee (urine) clear or pale yellow.  Avoid smoking and secondhand smoke. If you smoke and you need help quitting, ask your doctor. Quitting will help your lungs heal faster.  Use an inhaler, cool mist vaporizer, or humidifier as told by your doctor.  Keep all follow-up visits as told by your doctor. This is important. How is this prevented? To lower your risk of getting this condition again:  Wash your hands often with soap and water. If you cannot use soap and water, use hand  sanitizer.  Avoid contact with people who have cold symptoms.  Try not to touch your hands to your mouth, nose, or eyes.  Make sure to get the flu shot every year.  Contact a doctor if:  Your symptoms do not get better in 2 weeks. Get help right away if:  You cough up blood.  You have chest pain.  You have very bad shortness of breath.  You become dehydrated.  You faint (pass out) or keep feeling like you are going to pass out.  You keep throwing up (vomiting).  You have a very bad headache.  Your fever or chills gets worse. This information is not intended to replace advice given to you by your health care provider. Make sure you discuss any questions you have with your health care provider. Document Released: 08/08/2007 Document Revised: 09/28/2015 Document Reviewed: 08/10/2015 Elsevier Interactive Patient Education  2017 Elsevier  Inc.      Agustina Caroli, MD [facility]

## 2016-09-03 NOTE — Patient Instructions (Addendum)
     IF you received an x-ray today, you will receive an invoice from [facility]. Please contact  Radiology at [phone] with questions or concerns regarding your invoice.   IF you received labwork today, you will receive an invoice from [facility]. Please contact [facility] at [phone] with questions or concerns regarding your invoice.   Our billing staff will not be able to assist you with questions regarding bills from these companies.  You will be contacted with the lab results as soon as they are available. The fastest way to get your results is to activate your My Chart account. Instructions are located on the last page of this paperwork. If you have not heard from us regarding the results in 2 weeks, please contact this office.      Acute Bronchitis, Adult Acute bronchitis is when air tubes (bronchi) in the lungs suddenly get swollen. The condition can make it hard to breathe. It can also cause these symptoms:  A cough.  Coughing up clear, yellow, or green mucus.  Wheezing.  Chest congestion.  Shortness of breath.  A fever.  Body aches.  Chills.  A sore throat.  Follow these instructions at home: Medicines  Take over-the-counter and prescription medicines only as told by your doctor.  If you were prescribed an antibiotic medicine, take it as told by your doctor. Do not stop taking the antibiotic even if you start to feel better. General instructions  Rest.  Drink enough fluids to keep your pee (urine) clear or pale yellow.  Avoid smoking and secondhand smoke. If you smoke and you need help quitting, ask your doctor. Quitting will help your lungs heal faster.  Use an inhaler, cool mist vaporizer, or humidifier as told by your doctor.  Keep all follow-up visits as told by your doctor. This is important. How is this prevented? To lower your risk of getting this condition again:  Wash your hands often with soap and water. If you cannot  use soap and water, use hand sanitizer.  Avoid contact with people who have cold symptoms.  Try not to touch your hands to your mouth, nose, or eyes.  Make sure to get the flu shot every year.  Contact a doctor if:  Your symptoms do not get better in 2 weeks. Get help right away if:  You cough up blood.  You have chest pain.  You have very bad shortness of breath.  You become dehydrated.  You faint (pass out) or keep feeling like you are going to pass out.  You keep throwing up (vomiting).  You have a very bad headache.  Your fever or chills gets worse. This information is not intended to replace advice given to you by your health care provider. Make sure you discuss any questions you have with your health care provider. Document Released: 08/08/2007 Document Revised: 09/28/2015 Document Reviewed: 08/10/2015 Elsevier Interactive Patient Education  2017 Elsevier Inc.  

## 2016-09-10 ENCOUNTER — Telehealth: Payer: Self-pay | Admitting: Emergency Medicine

## 2016-09-10 NOTE — Telephone Encounter (Signed)
Pt is calling to let us know that she still having congestion and would like a refill on her antibiotic  Best number [phone]

## 2016-09-11 NOTE — Telephone Encounter (Signed)
Please advise 

## 2016-09-11 NOTE — Telephone Encounter (Signed)
Patient aware. Will wait one week and see if she feels better.

## 2016-09-11 NOTE — Telephone Encounter (Signed)
More antibiotics not indicated at this time. Probably all she needs is more time so it can run its course. However, if not better, she should be re-evaluated.

## 2017-04-15 ENCOUNTER — Telehealth: Payer: Self-pay | Admitting: Urgent Care

## 2017-04-15 ENCOUNTER — Ambulatory Visit: Payer: BLUE CROSS/BLUE SHIELD | Admitting: Physician Assistant

## 2017-04-15 ENCOUNTER — Other Ambulatory Visit: Payer: Self-pay

## 2017-04-15 ENCOUNTER — Encounter: Payer: Self-pay | Admitting: Physician Assistant

## 2017-04-15 VITALS — BP 122/70 | HR 80 | Temp 98.4°F | Resp 16 | Ht 68.0 in | Wt 171.0 lb

## 2017-04-15 DIAGNOSIS — J0140 Acute pansinusitis, unspecified: Secondary | ICD-10-CM

## 2017-04-15 DIAGNOSIS — J22 Unspecified acute lower respiratory infection: Secondary | ICD-10-CM | POA: Diagnosis not present

## 2017-04-15 MED ORDER — GUAIFENESIN ER 1200 MG PO TB12: 1.0000 | ORAL_TABLET | Freq: Two times a day (BID) | ORAL | 1 refills | Status: DC | PRN

## 2017-04-15 MED ORDER — DOXYCYCLINE HYCLATE 100 MG PO CAPS: 100.0000 mg | ORAL_CAPSULE | Freq: Two times a day (BID) | ORAL | 0 refills | Status: AC

## 2017-04-15 MED ORDER — HYDROCOD POLST-CPM POLST ER 10-8 MG/5ML PO SUER: 5.0000 mL | Freq: Every evening | ORAL | 0 refills | Status: DC | PRN

## 2017-04-15 MED ORDER — PREDNISONE 20 MG PO TABS: ORAL_TABLET | ORAL | 0 refills | Status: DC

## 2017-04-15 MED ORDER — DOXYCYCLINE HYCLATE 100 MG PO CAPS: 100.0000 mg | ORAL_CAPSULE | Freq: Two times a day (BID) | ORAL | 0 refills | Status: DC

## 2017-04-15 NOTE — Progress Notes (Signed)
Should this med print at the office? It has been rejected by the pharmacy. Please review.

## 2017-04-15 NOTE — Patient Instructions (Addendum)
Please make sure you are hydrating well--64oz of water if not more.   Make sure you take the prednisone in the morning.   Sinusitis, Adult Sinusitis is soreness and inflammation of your sinuses. Sinuses are hollow spaces in the bones around your face. They are located:  Around your eyes.  In the middle of your forehead.  Behind your nose.  In your cheekbones.  Your sinuses and nasal passages are lined with a stringy fluid (mucus). Mucus normally drains out of your sinuses. When your nasal tissues get inflamed or swollen, the mucus can get trapped or blocked so air cannot flow through your sinuses. This lets bacteria, viruses, and funguses grow, and that leads to infection. Follow these instructions at home: Medicines  Take, use, or apply over-the-counter and prescription medicines only as told by your doctor. These may include nasal sprays.  If you were prescribed an antibiotic medicine, take it as told by your doctor. Do not stop taking the antibiotic even if you start to feel better. Hydrate and Humidify  Drink enough water to keep your pee (urine) clear or pale yellow.  Use a cool mist humidifier to keep the humidity level in your home above 50%.  Breathe in steam for 10-15 minutes, 3-4 times a day or as told by your doctor. You can do this in the bathroom while a hot shower is running.  Try not to spend time in cool or dry air. Rest  Rest as much as possible.  Sleep with your head raised (elevated).  Make sure to get enough sleep each night. General instructions  Put a warm, moist washcloth on your face 3-4 times a day or as told by your doctor. This will help with discomfort.  Wash your hands often with soap and water. If there is no soap and water, use hand sanitizer.  Do not smoke. Avoid being around people who are smoking (secondhand smoke).  Keep all follow-up visits as told by your doctor. This is important. Contact a doctor if:  You have a fever.  Your  symptoms get worse.  Your symptoms do not get better within 10 days. Get help right away if:  You have a very bad headache.  You cannot stop throwing up (vomiting).  You have pain or swelling around your face or eyes.  You have trouble seeing.  You feel confused.  Your neck is stiff.  You have trouble breathing. This information is not intended to replace advice given to you by your health care provider. Make sure you discuss any questions you have with your health care provider. Document Released: 08/08/2007 Document Revised: 10/16/2015 Document Reviewed: 12/15/2014 Elsevier Interactive Patient Education  2018 [employer].    IF you received an x-ray today, you will receive an invoice from [facility]. Please contact [facility] at [phone] with questions or concerns regarding your invoice.   IF you received labwork today, you will receive an invoice from [place]. Please contact [facility] at [phone] with questions or concerns regarding your invoice.   Our billing staff will not be able to assist you with questions regarding bills from these companies.  You will be contacted with the lab results as soon as they are available. The fastest way to get your results is to activate your My Chart account. Instructions are located on the last page of this paperwork. If you have not heard from [place] regarding the results in 2 weeks, please contact this office.

## 2017-04-15 NOTE — Telephone Encounter (Signed)
Message sent to provider 

## 2017-04-15 NOTE — Progress Notes (Signed)
PRIMARY CARE AT [facility] [address] [place] [address] [phone]  Date:  04/15/2017   Name:  Beth Schmidt   DOB:  1955/04/08   MRN:  [id number]  PCP:  Molli Posey, MD    History of Present Illness:  Beth Schmidt is a 62 y.o. female patient who presents to PCP with  Chief Complaint  Patient presents with  . Sinusitis    nasal congestion/ x 2 wks,sinus headache  . Cough    chest congestion/ x 1 wks     3 weeks, ago sinus pressure and coughing, and congestion.  She started taking mucinex sinus max.  Resolved sinus pain, which helped, but continued to have the cough.  She tried benadryl, but this has not resolved.   Cough is non-productive.  She has chest presure, and headache.  She has post nasal drainage.  No sob or dyspnea.  No fever.  No hx asthma.  She has never needed an inhaler.  Former smoker 2011.  She is around second hand.    Patient Active Problem List   Diagnosis Date Noted  . Cough 09/03/2016  . Acute bronchitis 09/03/2016  . Influenza with respiratory manifestation other than pneumonia 04/19/2016  . HYPOTENSION 11/16/2008  . HYPERLIPIDEMIA 11/12/2008  . ANXIETY, CHRONIC 11/12/2008  . TOBACCO ABUSE 11/12/2008  . MITRAL REGURGITATION, MILD 11/12/2008  . LUNG NODULE 11/12/2008  . LEG CRAMPS 11/12/2008  . FRACTURE, WRIST, LEFT 11/12/2008    Past Medical History:  Diagnosis Date  . Anxiety   . Atrial fibrillation   . Hyperlipidemia   . Palpitations   . Rheumatic fever   . SVT (supraventricular tachycardia) (HCC)     Past Surgical History:  Procedure Laterality Date  . CARDIAC CATHETERIZATION  06/01/2005   EF 60%  . US ECHOCARDIOGRAPHY  05/14/2008   EF 55-60%    Social History   Tobacco Use  . Smoking status: Never Smoker  . Smokeless tobacco: Never Used  Substance Use Topics  . Alcohol use: No  . Drug use: No    Family History  Problem Relation Age of Onset  . Cardiomyopathy Mother   . Coronary artery disease Father      No Active Allergies  Medication list has been reviewed and updated.  No current outpatient medications on file prior to visit.   No current facility-administered medications on file prior to visit.     ROS ROS otherwise unremarkable unless listed above.  Physical Examination: BP 122/70   Pulse 80   Temp 98.4 F (36.9 C) (Oral)   Resp 16   Ht 5\' 8"  (1.727 m)   Wt 171 lb (77.6 kg)   SpO2 97%   BMI 26.00 kg/m  Ideal Body Weight: Weight in (lb) to have BMI = 25: 164.1  Physical Exam  Constitutional: She is oriented to person, place, and time. She appears well-developed and well-nourished. No distress.  HENT:  Head: Normocephalic and atraumatic.  Right Ear: Tympanic membrane, external ear and ear canal normal.  Left Ear: Tympanic membrane, external ear and ear canal normal.  Nose: Mucosal edema and rhinorrhea present. Right sinus exhibits maxillary sinus tenderness. Right sinus exhibits no frontal sinus tenderness. Left sinus exhibits maxillary sinus tenderness. Left sinus exhibits no frontal sinus tenderness.  Mouth/Throat: No uvula swelling. No oropharyngeal exudate, posterior oropharyngeal edema or posterior oropharyngeal erythema.  Eyes: Conjunctivae and EOM are normal. Pupils are equal, round, and reactive to light.  Cardiovascular: Normal rate and regular rhythm. Exam  reveals no gallop, no distant heart sounds and no friction rub.  No murmur heard. Pulmonary/Chest: Effort normal. No respiratory distress. She has no decreased breath sounds. She has no wheezes. She has no rhonchi.  Lymphadenopathy:       Head (right side): No submandibular, no tonsillar, no preauricular and no posterior auricular adenopathy present.       Head (left side): No submandibular, no tonsillar, no preauricular and no posterior auricular adenopathy present.  Neurological: She is alert and oriented to person, place, and time.  Skin: She is not diaphoretic.  Psychiatric: She has a normal mood and  affect. Her behavior is normal.     Assessment and Plan: Beth Schmidt is a 62 y.o. female who is here today for cc of  Chief Complaint  Patient presents with  . Sinusitis    nasal congestion/ x 2 wks,sinus headache  . Cough    chest congestion/ x 1 wks   Acute non-recurrent pansinusitis - Plan: DISCONTINUED: doxycycline (VIBRAMYCIN) 100 MG capsule, DISCONTINUED: predniSONE (DELTASONE) 20 MG tablet, DISCONTINUED: Guaifenesin (MUCINEX MAXIMUM STRENGTH) 1200 MG TB12, DISCONTINUED: chlorpheniramine-HYDROcodone (TUSSIONEX PENNKINETIC ER) 10-8 MG/5ML SUER  Lower respiratory infection (e.g., bronchitis, pneumonia, pneumonitis, pulmonitis)  Ivar Drape, PA-C [facility] 2/11/20191:34 PM

## 2017-04-15 NOTE — Telephone Encounter (Signed)
Copied from [place]. Topic: General - Other >> Apr 15, 2017  9:55 AM Lolita Rieger, RMA wrote: Reason for CRM: pt would like for medication sent to the [facility] on [facility] in [place]

## 2017-04-22 ENCOUNTER — Telehealth: Payer: Self-pay | Admitting: Physician Assistant

## 2017-04-22 NOTE — Telephone Encounter (Signed)
Copied from [place]. Topic: Quick Communication - See Telephone Encounter >> Apr 22, 2017  9:38 AM Conception Chancy, NT wrote: CRM for notification. See Telephone encounter for:  04/22/17.  Patient was seen on 04/15/16 by Ivar Drape, she states she is feeling better and her chest is not hurting anymore but she is having congestion. She leaves for [place] on 04/27/17 and would like this taken care of before then. Patient is requesting another script called in. Please advise.   [place] ([place]), [place]

## 2017-04-24 NOTE — Telephone Encounter (Signed)
Pt. Called back regarding medication called in for Congestion.. They are leaving Sat. And saw [place].  Can you please see if S. English can send in a prescription for her pharmacy.  [facility] ([address]), [place]  [address] [place] ([place]) [place] [address]  Phone: [phone] Fax: [phone]   She does not have PCP designated and Chelle is out of office

## 2017-04-24 NOTE — Telephone Encounter (Signed)
Please call patient when prescription is sent to [facility] from [place]

## 2017-06-04 ENCOUNTER — Encounter: Payer: Self-pay | Admitting: Physician Assistant

## 2017-08-19 ENCOUNTER — Ambulatory Visit (INDEPENDENT_AMBULATORY_CARE_PROVIDER_SITE_OTHER): Payer: BLUE CROSS/BLUE SHIELD | Admitting: Orthopaedic Surgery

## 2017-08-19 ENCOUNTER — Encounter (INDEPENDENT_AMBULATORY_CARE_PROVIDER_SITE_OTHER): Payer: Self-pay | Admitting: Orthopaedic Surgery

## 2017-08-19 DIAGNOSIS — S93402A Sprain of unspecified ligament of left ankle, initial encounter: Secondary | ICD-10-CM

## 2017-08-19 NOTE — Progress Notes (Signed)
Office Visit Note   Patient: Beth Schmidt           Date of Birth: August 20, 1955           MRN: [id number] Visit Date: 08/19/2017              Requested by: Molli Posey, [employer] [place], [place] [id number] PCP: Molli Posey, MD   Assessment & Plan: Visit Diagnoses:  1. Moderate left ankle sprain, initial encounter     Plan: This seems more of just a severe ankle sprain and I gave her reassurance she should do well and even just an ASO.  All question concerns were answered and addressed.  She understands it can take 4 to 6 weeks to feel better.  Follow-up will be as needed.  Follow-Up Instructions: Return if symptoms worsen or fail to improve.   Orders:  No orders of the defined types were placed in this encounter.  No orders of the defined types were placed in this encounter.     Procedures: No procedures performed   Clinical Data: No additional findings.   Subjective: Chief Complaint  Patient presents with  . Left Foot - Pain    DOI 08/11/17  . Right Knee - Pain, Open Wound    DOI 08/11/17   Patient is a very pleasant 62 year old female who comes in over a week out from a left ankle sprain/injury as well as a right knee contusion.  She was seen in urgent care center and given Augmentin for the knee abrasion which she is been keeping clean.  She was told that she may have some type of bone fragment her ankle and was given a prescription for a cam walking boot.  She is also given follow-up in our office today.  She reports lateral ankle pain and foot pain and is been walking around with regular shoes and try to keep weight off her foot.  She is to work on ice and elevation as well.  She denies any other injuries.  She denies any numbness and tingling in her foot. HPI  Review of Systems She is alert and oriented x3 and in no acute distress.  She denies any headache, chest pain, shortness of breath, fever, chills, nausea,  vomiting.  Objective: Vital Signs: There were no vitals taken for this visit.  Physical Exam  Ortho Exam Examination of her right knee shows a contusion and abrasion over the anterior aspect of the patella area near the tibial tubercle and there is no evidence of infection at all.  Examination of her left ankle does show lateral ankle swelling and bruising and pain over the anterior talofibular ligament.  Ankle is ligamentously stable but has a painful range of motion.  It is neurovascularly intact. Specialty Comments:  No specialty comments available.  Imaging: No results found. X-rays of the company of her of the left ankle show 3 views of the left ankle.  These are independently reviewed.  I see no evidence of fracture dislocation or malalignment.  There is soft tissue swelling.  PMFS History: Patient Active Problem List   Diagnosis Date Noted  . Cough 09/03/2016  . Acute bronchitis 09/03/2016  . Influenza with respiratory manifestation other than pneumonia 04/19/2016  . HYPOTENSION 11/16/2008  . HYPERLIPIDEMIA 11/12/2008  . ANXIETY, CHRONIC 11/12/2008  . TOBACCO ABUSE 11/12/2008  . MITRAL REGURGITATION, MILD 11/12/2008  . LUNG NODULE 11/12/2008  . LEG CRAMPS 11/12/2008  . FRACTURE, WRIST, LEFT 11/12/2008  Past Medical History:  Diagnosis Date  . Anxiety   . Atrial fibrillation   . Hyperlipidemia   . Palpitations   . Rheumatic fever   . SVT (supraventricular tachycardia) (HCC)     Family History  Problem Relation Age of Onset  . Cardiomyopathy Mother   . Coronary artery disease Father     Past Surgical History:  Procedure Laterality Date  . CARDIAC CATHETERIZATION  06/01/2005   EF 60%  . US ECHOCARDIOGRAPHY  05/14/2008   EF 55-60%   Social History   Occupational History  . Not on file  Tobacco Use  . Smoking status: Never Smoker  . Smokeless tobacco: Never Used  Substance and Sexual Activity  . Alcohol use: No  . Drug use: No  . Sexual activity: Not on  file

## 2018-01-07 ENCOUNTER — Ambulatory Visit (INDEPENDENT_AMBULATORY_CARE_PROVIDER_SITE_OTHER): Payer: BLUE CROSS/BLUE SHIELD

## 2018-01-07 ENCOUNTER — Other Ambulatory Visit: Payer: Self-pay

## 2018-01-07 ENCOUNTER — Ambulatory Visit: Payer: BLUE CROSS/BLUE SHIELD | Admitting: Physician Assistant

## 2018-01-07 ENCOUNTER — Encounter: Payer: Self-pay | Admitting: Physician Assistant

## 2018-01-07 VITALS — BP 111/71 | HR 81 | Temp 98.1°F | Resp 18 | Ht 69.09 in | Wt 170.4 lb

## 2018-01-07 DIAGNOSIS — M1711 Unilateral primary osteoarthritis, right knee: Secondary | ICD-10-CM

## 2018-01-07 DIAGNOSIS — G8929 Other chronic pain: Secondary | ICD-10-CM | POA: Diagnosis not present

## 2018-01-07 DIAGNOSIS — M25561 Pain in right knee: Secondary | ICD-10-CM

## 2018-01-07 MED ORDER — TRAMADOL HCL 50 MG PO TABS: 50.0000 mg | ORAL_TABLET | Freq: Three times a day (TID) | ORAL | 0 refills | Status: DC | PRN

## 2018-01-07 MED ORDER — NAPROXEN 500 MG PO TABS: 500.0000 mg | ORAL_TABLET | Freq: Two times a day (BID) | ORAL | 0 refills | Status: DC

## 2018-01-07 NOTE — Patient Instructions (Addendum)
  Start naproxen twice daily for the next week, if no improvement after one week, please let me know.     If you have lab work done today you will be contacted with your lab results within the next 2 weeks.  If you have not heard from [place] then please contact us. The fastest way to get your results is to register for My Chart.   Arthritis Arthritis means joint pain. It can also mean joint disease. A joint is a place where bones come together. People who have arthritis may have:  Red joints.  Swollen joints.  Stiff joints.  Warm joints.  A fever.  A feeling of being sick.  Follow these instructions at home: Pay attention to any changes in your symptoms. Take these actions to help with your pain and swelling. Medicines  Take over-the-counter and prescription medicines only as told by your doctor.  Do not take aspirin for pain if your doctor says that you may have gout. Activity  Rest your joint if your doctor tells you to.  Avoid activities that make the pain worse.  Exercise your joint regularly as told by your doctor. Try doing exercises like: ? Swimming. ? Water aerobics. ? Biking. ? Walking. Joint Care   If your joint is swollen, keep it raised (elevated) if told by your doctor.  If your joint feels stiff in the morning, try taking a warm shower.  If you have diabetes, do not apply heat without asking your doctor.  If told, apply heat to the joint: ? Put a towel between the joint and the hot pack or heating pad. ? Leave the heat on the area for 20-30 minutes.  If told, apply ice to the joint: ? Put ice in a plastic bag. ? Place a towel between your skin and the bag. ? Leave the ice on for 20 minutes, 2-3 times per day.  Keep all follow-up visits as told by your doctor. Contact a doctor if:  The pain gets worse.  You have a fever. Get help right away if:  You have very bad pain in your joint.  You have swelling in your joint.  Your joint is  red.  Many joints become painful and swollen.  You have very bad back pain.  Your leg is very weak.  You cannot control your pee (urine) or poop (stool). This information is not intended to replace advice given to you by your health care provider. Make sure you discuss any questions you have with your health care provider. Document Released: 05/16/2009 Document Revised: 07/28/2015 Document Reviewed: 05/17/2014 Elsevier Interactive Patient Education  2018 [employer].  IF you received an x-ray today, you will receive an invoice from [facility]. Please contact [facility] at [phone] with questions or concerns regarding your invoice.   IF you received labwork today, you will receive an invoice from [facility]. Please contact [facility] at [phone] with questions or concerns regarding your invoice.   Our billing staff will not be able to assist you with questions regarding bills from these companies.  You will be contacted with the lab results as soon as they are available. The fastest way to get your results is to activate your My Chart account. Instructions are located on the last page of this paperwork. If you have not heard from [place] regarding the results in 2 weeks, please contact this office.

## 2018-01-07 NOTE — Progress Notes (Signed)
Beth Schmidt  MRN: [id number] DOB: June 01, 1955  Subjective:  Beth Schmidt is a 62 y.o. female seen in office today for a chief complaint of knee pain x 5 months. Started after falling and scraping it. Went to urgent care, placed on Augmentin for surrounding cellulitis. Pain resolved. Then got put on vit d supplement and notes it returned.  Aggravated by: "I don't know, if I did I would not do it." Relieved: rest in a certain position.  Denies fever, chills, nausea, vomiting. Numbness, tingling. Has tried intermittent advil, aleve, tylenol with no relief.  Denies smoking.  No PMH of diabete, gout,  or MRSA.'' PSH of b/l knee surgeries but that was 20+ years ago.  Review of Systems  Per HPI  Patient Active Problem List   Diagnosis Date Noted  . Cough 09/03/2016  . Acute bronchitis 09/03/2016  . Influenza with respiratory manifestation other than pneumonia 04/19/2016  . HYPOTENSION 11/16/2008  . HYPERLIPIDEMIA 11/12/2008  . ANXIETY, CHRONIC 11/12/2008  . TOBACCO ABUSE 11/12/2008  . MITRAL REGURGITATION, MILD 11/12/2008  . LUNG NODULE 11/12/2008  . LEG CRAMPS 11/12/2008  . FRACTURE, WRIST, LEFT 11/12/2008    No current outpatient medications on file prior to visit.   No current facility-administered medications on file prior to visit.     No Known Allergies    Social History   Socioeconomic History  . Marital status: Married    Spouse name: Not on file  . Number of children: 1  . Years of education: Not on file  . Highest education level: Not on file  Occupational History  . Not on file  Social Needs  . Financial resource strain: Not on file  . Food insecurity:    Worry: Not on file    Inability: Not on file  . Transportation needs:    Medical: Not on file    Non-medical: Not on file  Tobacco Use  . Smoking status: Never Smoker  . Smokeless tobacco: Never Used  Substance and Sexual Activity  . Alcohol use: No  . Drug use: No  . Sexual activity: Yes    Lifestyle  . Physical activity:    Days per week: Not on file    Minutes per session: Not on file  . Stress: Not on file  Relationships  . Social connections:    Talks on phone: Not on file    Gets together: Not on file    Attends religious service: Not on file    Active member of club or organization: Not on file    Attends meetings of clubs or organizations: Not on file    Relationship status: Not on file  . Intimate partner violence:    Fear of current or ex partner: Not on file    Emotionally abused: Not on file    Physically abused: Not on file    Forced sexual activity: Not on file  Other Topics Concern  . Not on file  Social History Narrative  . Not on file    Objective:  BP 111/71   Pulse 81   Temp 98.1 F (36.7 C) (Oral)   Resp 18   Ht 5' 9.09" (1.755 m)   Wt 170 lb 6.4 oz (77.3 kg)   SpO2 97%   BMI 25.10 kg/m   Physical Exam  Constitutional: She is oriented to person, place, and time. She appears well-developed and well-nourished.  HENT:  Head: Normocephalic and atraumatic.  Eyes: Conjunctivae are normal.  Neck:  Normal range of motion.  Pulmonary/Chest: Effort normal.  Musculoskeletal:       Right knee: She exhibits normal range of motion, no swelling, normal patellar mobility and no bony tenderness. Tenderness (+TTP at pes anserine bursa and medial joint line) found. Medial joint line tenderness noted. No lateral joint line, no MCL, no LCL and no patellar tendon tenderness noted.       Left knee: Normal.  Hyperpigmented old scar on anterior aspect of right knee, no surrounding erythema, warmth, or induration.   Neurological: She is alert and oriented to person, place, and time.  Reflex Scores:      Patellar reflexes are 2+ on the right side.      Achilles reflexes are 2+ on the right side and 2+ on the left side. Sensation of BLE intact.  Strength of BLE 5/5.  Skin: Skin is warm and dry.  Psychiatric: She has a normal mood and affect.  Vitals  reviewed.  Dg Knee Complete 4 Views Right  Result Date: 01/07/2018 CLINICAL DATA:  Initial evaluation for acute knee pain. EXAM: RIGHT KNEE - COMPLETE 4+ VIEW COMPARISON:  None. FINDINGS: Mild osteoarthritic changes seen involving the medial and lateral femorotibial joint space compartments. Superimposed chondrocalcinosis, which can be seen the setting of underlying CPPD arthropathy. No acute fracture or dislocation. No joint effusion. Osseous mineralization normal. No soft tissue abnormality. IMPRESSION: 1. No acute osseous abnormality about the right knee. 2. 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 Electronically Signed   By: Jeannine Boga M.D.   On: 01/07/2018 18:00    Assessment and Plan :  1. Chronic pain of right knee - DG Knee Complete 4 Views Right; Future - Ambulatory referral to Sports Medicine  2. Osteoarthritis of right knee, unspecified osteoarthritis type No concern for infection. 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 No overlying swelling, erythema, or warmth noted. Will attempt 1 week trial of naproxen daily as prescribed with tramadol for breakthrough pain. Plan for f/u with sports med if pain persists. Given strict return precautions.   - naproxen (NAPROSYN) 500 MG tablet; Take 1 tablet (500 mg total) by mouth 2 (two) times daily with a meal.  Dispense: 30 tablet; Refill: 0 - traMADol (ULTRAM) 50 MG tablet; Take 1 tablet (50 mg total) by mouth every 8 (eight) hours as needed.  Dispense: 20 tablet; Refill: 0    Tenna Delaine PA-C  [facility] [place] 01/07/2018 6:03 PM

## 2018-01-27 ENCOUNTER — Other Ambulatory Visit: Payer: Self-pay | Admitting: Physician Assistant

## 2018-01-27 DIAGNOSIS — M1711 Unilateral primary osteoarthritis, right knee: Secondary | ICD-10-CM

## 2018-01-28 NOTE — Telephone Encounter (Signed)
Pt requesting refill of Naproxen 500mg  last prescribed by B. Timmothy Euler. Spoke with pt to establish with another provider. Declines. States she was told refills would be available and "Ms. Timmothy Euler wrote notes to that effect so I would have refills available."  If appropriate to refill: Pete Glatter

## 2018-01-29 NOTE — Telephone Encounter (Signed)
Pt calling wanting to know what"s the hold up on the refill of the Naproxen she would like for this to be sent to her pharmacy before the holiday please call when RX was sent at [phone] please send to the [facility]

## 2018-02-03 NOTE — Telephone Encounter (Signed)
Medication refilled for 30 tabs. Beth Schmidt notes states that if pain was not getting better she would refer her to sports medicine as daily naproxen use is not recommended. Please ask patient is she wishes for me to make referral. thanks

## 2018-04-09 ENCOUNTER — Ambulatory Visit: Payer: BLUE CROSS/BLUE SHIELD | Admitting: Physician Assistant

## 2018-05-04 HISTORY — PX: CATARACT EXTRACTION, BILATERAL: SHX1313

## 2018-09-10 ENCOUNTER — Telehealth: Payer: Self-pay | Admitting: General Practice

## 2018-09-10 NOTE — Telephone Encounter (Signed)
Pt would like a refill for naproxen (NAPROSYN) 500 MG tablet  For the arthritis in her knee/ please advise   Send to   [facility] (SE),  - [place] [phone] (Phone) [phone] (Fax)

## 2018-09-15 ENCOUNTER — Telehealth (INDEPENDENT_AMBULATORY_CARE_PROVIDER_SITE_OTHER): Payer: Medicare Other | Admitting: Registered Nurse

## 2018-09-15 ENCOUNTER — Encounter: Payer: Self-pay | Admitting: Registered Nurse

## 2018-09-15 ENCOUNTER — Other Ambulatory Visit: Payer: Self-pay

## 2018-09-15 DIAGNOSIS — Z13220 Encounter for screening for lipoid disorders: Secondary | ICD-10-CM

## 2018-09-15 DIAGNOSIS — M1711 Unilateral primary osteoarthritis, right knee: Secondary | ICD-10-CM

## 2018-09-15 DIAGNOSIS — M11269 Other chondrocalcinosis, unspecified knee: Secondary | ICD-10-CM | POA: Diagnosis not present

## 2018-09-15 DIAGNOSIS — Z130 Encounter for screening for diseases of the blood and blood-forming organs and certain disorders involving the immune mechanism: Secondary | ICD-10-CM

## 2018-09-15 MED ORDER — NAPROXEN 500 MG PO TABS
ORAL_TABLET | ORAL | 0 refills | Status: DC
Start: 2018-09-15 — End: 2021-04-17

## 2018-09-15 NOTE — Progress Notes (Signed)
Spoke with pt this afternoon and she needs a new PCP to manage her medication for her knee pain.  She states she does need a refill at this time, and has no other concerns.

## 2018-09-15 NOTE — Progress Notes (Signed)
Telemedicine Encounter- SOAP NOTE Established Patient  This telephone encounter was conducted with the patient's (or proxy's) verbal consent via audio telecommunications: yes  Patient was instructed to have this encounter in a suitably private space; and to only have persons present to whom they give permission to participate. In addition, patient identity was confirmed by use of name plus two identifiers (DOB and address).  I discussed the limitations, risks, security and privacy concerns of performing an evaluation and management service by telephone and the availability of in person appointments. I also discussed with the patient that there may be a patient responsible charge related to this service. The patient expressed understanding and agreed to proceed.  I spent a total of 12 minutes talking with the patient or their proxy.  No chief complaint on file.   Subjective   Beth Schmidt is a 63 y.o. established patient. Telephone visit today for medication refills.   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 This started after a bad fall she took in 2019. She had XRays after that revealed no fractures but likely for pseudogout. She was given tramadol and naproxen - she did not take the tramadol but has used the naproxen with good effect.  She requests a refill today as she is going on vacation soon and thinks that the increased activity is going to lead her to need more. No other urgent complaints.  Patient Active Problem List   Diagnosis Date Noted  . Cough 09/03/2016  . Acute bronchitis 09/03/2016  . Influenza with respiratory manifestation other than pneumonia 04/19/2016  . HYPOTENSION 11/16/2008  . HYPERLIPIDEMIA 11/12/2008  . ANXIETY, CHRONIC 11/12/2008  . TOBACCO ABUSE 11/12/2008  . MITRAL REGURGITATION, MILD 11/12/2008  . LUNG NODULE 11/12/2008  . LEG CRAMPS 11/12/2008  . FRACTURE, WRIST, LEFT 11/12/2008    Past Medical History:  Diagnosis Date   . Anxiety   . Atrial fibrillation   . Hyperlipidemia   . Palpitations   . Rheumatic fever   . SVT (supraventricular tachycardia) (HCC)     Current Outpatient Medications  Medication Sig Dispense Refill  . naproxen (NAPROSYN) 500 MG tablet TAKE 1 TABLET BY MOUTH TWICE DAILY WITH A MEAL 30 tablet 0   No current facility-administered medications for this visit.     No Known Allergies  Social History   Socioeconomic History  . Marital status: Married    Spouse name: Not on file  . Number of children: 1  . Years of education: Not on file  . Highest education level: Not on file  Occupational History  . Not on file  Social Needs  . Financial resource strain: Not hard at all  . Food insecurity    Worry: Never true    Inability: Never true  . Transportation needs    Medical: No    Non-medical: No  Tobacco Use  . Smoking status: Never Smoker  . Smokeless tobacco: Never Used  Substance and Sexual Activity  . Alcohol use: No  . Drug use: No  . Sexual activity: Yes  Lifestyle  . Physical activity    Days per week: 0 days    Minutes per session: 0 min  . Stress: Only a little  Relationships  . Social Herbalist on phone: Three times a week    Gets together: Twice a week    Attends religious service: More than 4 times per year    Active member of club  or organization: No    Attends meetings of clubs or organizations: Never    Relationship status: Married  . Intimate partner violence    Fear of current or ex partner: No    Emotionally abused: No    Physically abused: No    Forced sexual activity: No  Other Topics Concern  . Not on file  Social History Narrative  . Not on file    ROS  Objective   Vitals as reported by the patient: There were no vitals filed for this visit.  Diagnoses and all orders for this visit:  Pseudogout of knee, unspecified laterality  Osteoarthritis of right knee, unspecified osteoarthritis type -     naproxen (NAPROSYN)  500 MG tablet; TAKE 1 TABLET BY MOUTH TWICE DAILY WITH A MEAL   PLAN  Refill naproxen  Ordered future labs, encouraged patient to return for labs and CPE  Patient encouraged to call clinic with any questions, comments, or concerns.     I discussed the assessment and treatment plan with the patient. The patient was provided an opportunity to ask questions and all were answered. The patient agreed with the plan and demonstrated an understanding of the instructions.   The patient was advised to call back or seek an in-person evaluation if the symptoms worsen or if the condition fails to improve as anticipated.  I provided 12 minutes of non-face-to-face time during this encounter.  Maximiano Coss, NP  Primary [facility] at [facility]

## 2018-11-12 ENCOUNTER — Encounter: Payer: Self-pay | Admitting: Registered Nurse

## 2019-05-12 DIAGNOSIS — Z0000 Encounter for general adult medical examination without abnormal findings: Secondary | ICD-10-CM | POA: Diagnosis not present

## 2019-05-12 DIAGNOSIS — Z86010 Personal history of colonic polyps: Secondary | ICD-10-CM | POA: Diagnosis not present

## 2019-05-12 DIAGNOSIS — E785 Hyperlipidemia, unspecified: Secondary | ICD-10-CM | POA: Diagnosis not present

## 2019-05-12 DIAGNOSIS — I10 Essential (primary) hypertension: Secondary | ICD-10-CM | POA: Diagnosis not present

## 2019-09-30 DIAGNOSIS — Z86010 Personal history of colonic polyps: Secondary | ICD-10-CM | POA: Diagnosis not present

## 2019-12-28 DIAGNOSIS — Z961 Presence of intraocular lens: Secondary | ICD-10-CM | POA: Diagnosis not present

## 2019-12-29 ENCOUNTER — Ambulatory Visit: Payer: Self-pay | Admitting: Cardiology

## 2020-01-14 DIAGNOSIS — J019 Acute sinusitis, unspecified: Secondary | ICD-10-CM | POA: Diagnosis not present

## 2020-02-29 ENCOUNTER — Emergency Department (HOSPITAL_COMMUNITY)
Admission: EM | Admit: 2020-02-29 | Discharge: 2020-03-01 | Disposition: A | Discharge status: Home / Self Care | Payer: Medicare Other | Attending: Emergency Medicine | Admitting: Emergency Medicine

## 2020-02-29 ENCOUNTER — Emergency Department (HOSPITAL_COMMUNITY): Payer: Medicare Other

## 2020-02-29 ENCOUNTER — Other Ambulatory Visit: Payer: Self-pay

## 2020-02-29 DIAGNOSIS — Z7982 Long term (current) use of aspirin: Secondary | ICD-10-CM | POA: Diagnosis not present

## 2020-02-29 DIAGNOSIS — R079 Chest pain, unspecified: Secondary | ICD-10-CM | POA: Diagnosis not present

## 2020-02-29 DIAGNOSIS — R072 Precordial pain: Secondary | ICD-10-CM

## 2020-02-29 LAB — BASIC METABOLIC PANEL
Anion gap: 10 (ref 5–15)
BUN: 21 mg/dL (ref 8–23)
CO2: 23 mmol/L (ref 22–32)
Calcium: 8.6 mg/dL — ABNORMAL LOW (ref 8.9–10.3)
Chloride: 108 mmol/L (ref 98–111)
Creatinine, Ser: 1.17 mg/dL — ABNORMAL HIGH (ref 0.44–1.00)
GFR, Estimated: 51 mL/min — ABNORMAL LOW (ref >60)
Glucose, Bld: 115 mg/dL — ABNORMAL HIGH (ref 70–99)
Potassium: 4.3 mmol/L (ref 3.5–5.1)
Sodium: 141 mmol/L (ref 135–145)

## 2020-02-29 LAB — CBC
HCT: 39.6 % (ref 36.0–46.0)
Hemoglobin: 13.3 g/dL (ref 12.0–15.0)
MCH: 30.2 pg (ref 26.0–34.0)
MCHC: 33.6 g/dL (ref 30.0–36.0)
MCV: 89.8 fL (ref 80.0–100.0)
Platelets: 262 10*3/uL (ref 150–400)
RBC: 4.41 MIL/uL (ref 3.87–5.11)
RDW: 12.3 % (ref 11.5–15.5)
WBC: 6.4 10*3/uL (ref 4.0–10.5)
nRBC: 0 % (ref 0.0–0.2)

## 2020-02-29 LAB — TROPONIN I (HIGH SENSITIVITY): Troponin I (High Sensitivity): 3 ng/L (ref <18)

## 2020-02-29 NOTE — ED Triage Notes (Signed)
Pt presents to ED POV. Pt c/o central CP. Pain is nonradiating. Pt reports pain began at rest and moved up the center of her chest. Pt also c/o SOB.

## 2020-03-01 LAB — TROPONIN I (HIGH SENSITIVITY): Troponin I (High Sensitivity): 3 ng/L (ref <18)

## 2020-03-01 NOTE — ED Notes (Signed)
Patient verbalizes understanding of discharge instructions. Opportunity for questioning and answers were provided. Pt discharged from ED. 

## 2020-03-01 NOTE — ED Provider Notes (Signed)
[place] EMERGENCY DEPARTMENT Provider Note   CSN: [id number] Arrival date & time: 02/29/20  2100     History Chief Complaint  Patient presents with   Chest Pain    Beth Schmidt is a 64 y.o. female with history of hyperlipidemia, atrial fibrillation, SVT, mitral regurgitation, rheumatic fever.  Patient presents with a chief complaint of central chest pain.  Patient reports that the pain began yesterday at 2000, it came on after sneezing" hard," multiple times in a row while sitting at rest.  Patient reports that the pain was substernal, described as a cramping sensation, no radiation,10/10 on the pain scale, no alleviating or aggravating symptoms.  Patient symptoms yesterday began 2 hours after eating.  Patient reports that her pain has gradually gotten better and denies any pain at present.  Patient does feel like her chest is tight and she cannot take a full deep breath.  Patient states that she sometimes feels palpitations but did not have any associated with her episode of chest pain yesterday.  Patient reports that she has had similar events of chest pain in the past but reports this was worse.  Denies any association with eating with past chest pain episodes.  Patient reports that she had a cardiac angiography performed in 2008 but does not have explicit details of the results.  she has not been worked up for her other episodes of chest pain.   patient also reports that for a " couple months," she has had exertional shortness of breath.  Patient denies any leg swelling over this time.  Patient denies any fevers, chills, cough, shortness of breath, leg swelling, abdominal pain, nausea, vomiting, diaphoresis, lightheadedness, dizziness, headache, syncope, pleuritic chest pain.    HPI  HPI: A 64 year old patient with a history of hypercholesterolemia presents for evaluation of chest pain. Initial onset of pain was more than 6 hours ago. The patient's chest pain is  not worse with exertion. The patient's chest pain is middle- or left-sided, is not well-localized, is not described as heaviness/pressure/tightness, is not sharp and does not radiate to the arms/jaw/neck. The patient does not complain of nausea and denies diaphoresis. The patient has no history of stroke, has no history of peripheral artery disease, has not smoked in the past 90 days, denies any history of treated diabetes, has no relevant family history of coronary artery disease (first degree relative at less than age 50), is not hypertensive and does not have an elevated BMI (>=30).   Past Medical History:  Diagnosis Date   Anxiety    Atrial fibrillation    Hyperlipidemia    Palpitations    Rheumatic fever    SVT (supraventricular tachycardia) [facility])     Patient Active Problem List   Diagnosis Date Noted   Cough 09/03/2016   Acute bronchitis 09/03/2016   Influenza with respiratory manifestation other than pneumonia 04/19/2016   HYPOTENSION 11/16/2008   HYPERLIPIDEMIA 11/12/2008   ANXIETY, CHRONIC 11/12/2008   TOBACCO ABUSE 11/12/2008   MITRAL REGURGITATION, MILD 11/12/2008   LUNG NODULE 11/12/2008   LEG CRAMPS 11/12/2008   FRACTURE, WRIST, LEFT 11/12/2008    Past Surgical History:  Procedure Laterality Date   CARDIAC CATHETERIZATION  06/01/2005   EF 60%   CATARACT EXTRACTION, BILATERAL Bilateral 05/2018   US ECHOCARDIOGRAPHY  05/14/2008   EF 55-60%     OB History   No obstetric history on file.     Family History  Problem Relation Age of Onset  Cardiomyopathy Mother    Coronary artery disease Father     Social History   Tobacco Use   Smoking status: Never Smoker   Smokeless tobacco: Never Used  Vaping Use   Vaping Use: Never used  Substance Use Topics   Alcohol use: No   Drug use: No    Home Medications Prior to Admission medications   Medication Sig Start Date End Date Taking? Authorizing Provider  aspirin EC 81 MG tablet Take  81 mg by mouth daily. Swallow whole.   Yes [provider]  naproxen (NAPROSYN) 500 MG tablet TAKE 1 TABLET BY MOUTH TWICE DAILY WITH A MEAL Patient taking differently: Take 500 mg by mouth as needed (for knee pain). 09/15/18  Yes Maximiano Coss, NP    Allergies    Patient has no known allergies.  Review of Systems   Review of Systems  Constitutional: Negative for chills and fever.  HENT: Positive for sneezing.   Eyes: Negative for visual disturbance.  Respiratory: Positive for chest tightness. Negative for cough and shortness of breath.   Cardiovascular: Positive for chest pain and palpitations. Negative for leg swelling.  Gastrointestinal: Negative for abdominal pain, nausea and vomiting.  Genitourinary: Negative for difficulty urinating and dysuria.  Musculoskeletal: Negative for back pain and neck pain.  Skin: Negative for color change and rash.  Neurological: Negative for dizziness, syncope, light-headedness and headaches.  Psychiatric/Behavioral: Negative for confusion.    Physical Exam Updated Vital Signs BP 128/66    Pulse 65    Temp 97.8 F (36.6 C) (Oral)    Resp 16    Ht 5\' 9"  (1.753 m)    Wt 77.1 kg    SpO2 97%    BMI 25.10 kg/m   Physical Exam Vitals and nursing note reviewed.  Constitutional:      General: She is not in acute distress.    Appearance: She is not ill-appearing, toxic-appearing or diaphoretic.  HENT:     Head: Normocephalic.  Eyes:     General: No scleral icterus.       Right eye: No discharge.        Left eye: No discharge.  Cardiovascular:     Rate and Rhythm: Normal rate and regular rhythm.     Heart sounds: Normal heart sounds.  Pulmonary:     Effort: Pulmonary effort is normal.     Breath sounds: Normal breath sounds. No wheezing, rhonchi or rales.  Chest:     Chest wall: Tenderness present.     Comments: ttp over sternum Abdominal:     General: There is no distension.     Palpations: Abdomen is soft.     Tenderness: There  is no abdominal tenderness.  Musculoskeletal:     Cervical back: Neck supple.  Skin:    General: Skin is warm and dry.  Neurological:     General: No focal deficit present.     Mental Status: She is alert.  Psychiatric:        Behavior: Behavior is cooperative.     ED Results / Procedures / Treatments   Labs (all labs ordered are listed, but only abnormal results are displayed) Labs Reviewed  BASIC METABOLIC PANEL - Abnormal; Notable for the following components:      Result Value   Glucose, Bld 115 (*)    Creatinine, Ser 1.17 (*)    Calcium 8.6 (*)    GFR, Estimated 51 (*)    All other components within normal limits  CBC  TROPONIN I (HIGH SENSITIVITY)  TROPONIN I (HIGH SENSITIVITY)    EKG EKG Interpretation  Date/Time:  Monday February 29 2020 21:26:22 EST Ventricular Rate:  78 PR Interval:  152 QRS Duration: 74 QT Interval:  388 QTC Calculation: 442 R Axis:   46 Text Interpretation: Normal sinus rhythm Nonspecific ST and T wave abnormality Abnormal ECG When compared with ECG of 07/24/2008, ST depression in Inferior leads Anterolateral leads is now present Confirmed by Dione Booze ([id number]) on 03/01/2020 2:04:34 AM   Radiology DG Chest 2 View  Result Date: 02/29/2020 CLINICAL DATA:  Chest pain EXAM: CHEST - 2 VIEW COMPARISON:  None. FINDINGS: The heart size and mediastinal contours are within normal limits. Aortic knob calcifications are seen. Both lungs are clear. The visualized skeletal structures are unremarkable. IMPRESSION: No active cardiopulmonary disease. Electronically Signed   By: Jonna Clark M.D.   On: 02/29/2020 21:55    Procedures Procedures (including critical care time)  Medications Ordered in ED Medications - No data to display  ED Course  I have reviewed the triage vital signs and the nursing notes.  Pertinent labs & imaging results that were available during my care of the patient were reviewed by me and considered in my medical decision  making (see chart for details).    MDM Rules/Calculators/A&P HEAR Score: 4                        Alert 64 year old female no acute distress, nontoxic appearing.  Patient presents with chief complaint of substernal chest pain.  Patient denies any pain at present reporting that it has gradually gotten better since starting at 8:00 last night, she does complain of chest tightness at present but denies any shortness of breath.   Patient does have reproducible chest wall pain with palpation to sternum.  EKG showed minimal ST depression in anterior lateral leads, troponin were 3 and 3 with delta of 0. ACS is unlikely based on these findings.  Patient has a heart score of 4.  She reports that she has appointment with cardiology Dr. Sw[place] on January 5th.  Advised patient to call and see if they can move up their appointment however if not this is adequate follow-up.  Discussed that patient had an increase in her creatinine, increase oral hydration and follow-up with her primary care provider for further testing.    Discussed results, findings, treatment and follow up. Patient advised of return precautions. Patient verbalized understanding and agreed with plan.  Patient was discussed with and evaluated by Dr. Rosalia Hammers.     Final Clinical Impression(s) / ED Diagnoses Final diagnoses:  Precordial chest pain    Rx / DC Orders ED Discharge Orders    None       Haskel Schroeder, PA-C 03/01/20 1616    Margarita Grizzle, MD 03/02/20 [phone]

## 2020-03-01 NOTE — Discharge Instructions (Addendum)
You came to the emergency department to be evaluated for your chest pain.  Your EKG, lab work and physical exam were reassuring.  Based on these findings is not likely that you had a heart attack.  However it is important that you follow-up with your cardiology appointment.  Your lab work showed that your creatinine was elevated, please make sure to stay well-hydrated and follow-up with your primary care provider to have these labs reassessed.  Return to the ER for new or worsening symptoms; including worsening chest pain, shortness of breath, pain that radiates to the arm or neck, pain or shortness of breath worsened with exertion.

## 2020-03-03 NOTE — Progress Notes (Signed)
Cardiology Office Note   Date:  03/09/2020   ID:  Beth Schmidt, DOB 12-Feb-1956, MRN [id number]  PCP:  Donald Prose, MD  Cardiologist:   Zanyiah Posten [place], MD   Chief Complaint  Patient presents with   New Patient (Initial Visit)   Shortness of Breath   Chest Pain      History of Present Illness: Beth Schmidt is a 64 y.o. female who is seen at the request of Dr Nancy Fetter for evaluation of CV risk. She was seen remotely by Dr Mare Ferrari and  Dr Doreatha Lew and had a normal cardiac cath in 2007. She has a history of atrial tachycardia and paroxysmal Afib. Previously seen by Dr Caryl Comes. Last seen in 2010. Refractory to multiple drugs including Flecainide, propafenone, and Multaq. There was consideration for Tikosyn but this was never done. Since then she has been off all medication.   She was seen recently in the ED for evaluation of chest pain. Troponin levels were normal. Ecg showed no acute change. Her father Elder Cyphers passed away this past year. Since then she has noted progressive symptoms of chest pain. This begins as a cramp then progresses to the point of severe pain that brings her to tears. It will gradually abate. No relation to position, meals or activity. She reports intolerance to statins due to myalgias.     Past Medical History:  Diagnosis Date   Anxiety    Atrial fibrillation    Hyperlipidemia    Palpitations    Rheumatic fever    SVT (supraventricular tachycardia) (HCC)     Past Surgical History:  Procedure Laterality Date   arthroscopic knee     CARDIAC CATHETERIZATION  06/01/2005   EF 60%   CATARACT EXTRACTION, BILATERAL Bilateral 05/2018   US ECHOCARDIOGRAPHY  05/14/2008   EF 55-60%     Current Outpatient Medications  Medication Sig Dispense Refill   APPLE CIDER VINEGAR PO Take 1 capsule by mouth daily.     aspirin EC 81 MG tablet Take 81 mg by mouth daily. Swallow whole.     naproxen (NAPROSYN) 500 MG tablet TAKE 1 TABLET BY MOUTH TWICE  DAILY WITH A MEAL (Patient taking differently: Take 500 mg by mouth as needed (for knee pain).) 90 tablet 0   No current facility-administered medications for this visit.    Allergies:   Patient has no known allergies.    Social History:  The patient  reports that she has never smoked. She has never used smokeless tobacco. She reports that she does not drink alcohol and does not use drugs.   Family History:  The patient's family history includes Cardiomyopathy in her mother; Coronary artery disease in her father; Stroke in her mother.    ROS:  Please see the history of present illness.   Otherwise, review of systems are positive for .   All other systems are reviewed and negative.    PHYSICAL EXAM: VS:  BP 110/68 (BP Location: Left Arm, Patient Position: Sitting, Cuff Size: Normal)    Pulse 79    Ht 5\' 9"  (1.753 m)    Wt 180 lb (81.6 kg)    BMI 26.58 kg/m  , BMI Body mass index is 26.58 kg/m. GEN: Well nourished, well developed, in no acute distress  HEENT: normal  Neck: no JVD, carotid bruits, or masses Cardiac: RRR; no murmurs, rubs, or gallops,no edema  Respiratory:  clear to auscultation bilaterally, normal work of breathing GI: soft, nontender, nondistended, + BS  MS: no deformity or atrophy  Skin: warm and dry, no rash Neuro:  Strength and sensation are intact Psych: euthymic mood, full affect   EKG:  EKG is ordered today. The ekg ordered today demonstrates NSR rate 79. Nonspecific ST-T abnormality. I have personally reviewed and interpreted this study.    Recent Labs: 02/29/2020: BUN 21; Creatinine, Ser 1.17; Hemoglobin 13.3; Platelets 262; Potassium 4.3; Sodium 141    Lipid Panel No results found for: CHOL, TRIG, HDL, CHOLHDL, VLDL, LDLCALC, LDLDIRECT   Labs dated 05/12/19: cholesterol 212, triglycerides 138, HDL 53, LDL 146. LFTs and TSH normal  Wt Readings from Last 3 Encounters:  03/09/20 180 lb (81.6 kg)  03/01/20 170 lb (77.1 kg)  01/07/18 170 lb 6.4 oz  (77.3 kg)      Other studies Reviewed: Additional studies/ records that were reviewed today include: see above   ASSESSMENT AND PLAN:  1.  Precordial chest pain with intermediate risk for CAD. Recommend further ischemic evaluation. Discussed options including stress testing, coronary CTA or cardiac cath. I would favor CT since this is noninasive and given its high negative predictive value. This will also inform us on how aggressive we need to be with her cholesterol.  2. HLD. Await results of CT. She is intolerant of statins 3. History of atrial tachycardia. She is doing well on no therapy.   Current medicines are reviewed at length with the patient today.  The patient does not have concerns regarding medicines.  The following changes have been made:  no change  Labs/ tests ordered today include:   Orders Placed This Encounter  Procedures   CT CORONARY MORPH W/CTA COR W/SCORE W/CA W/CM &/OR WO/CM   CT CORONARY FRACTIONAL FLOW RESERVE DATA PREP   CT CORONARY FRACTIONAL FLOW RESERVE FLUID ANALYSIS   Comprehensive Metabolic Panel (CMET)   Lipid panel   EKG 12-Lead     Disposition:   FU TBD based on CT results.    Signed, Harrington Jobe Swazil[place], MD  03/09/2020 3:30 PM    [facility] [address] [place], [place], [address] Phone [phone], Fax [phone]

## 2020-03-07 DIAGNOSIS — M8588 Other specified disorders of bone density and structure, other site: Secondary | ICD-10-CM | POA: Diagnosis not present

## 2020-03-07 DIAGNOSIS — Z1231 Encounter for screening mammogram for malignant neoplasm of breast: Secondary | ICD-10-CM | POA: Diagnosis not present

## 2020-03-09 ENCOUNTER — Encounter: Payer: Self-pay | Admitting: Cardiology

## 2020-03-09 ENCOUNTER — Ambulatory Visit: Payer: Medicare Other | Admitting: Cardiology

## 2020-03-09 ENCOUNTER — Other Ambulatory Visit: Payer: Self-pay

## 2020-03-09 VITALS — BP 110/68 | HR 79 | Ht 69.0 in | Wt 180.0 lb

## 2020-03-09 DIAGNOSIS — R072 Precordial pain: Secondary | ICD-10-CM | POA: Diagnosis not present

## 2020-03-09 DIAGNOSIS — I208 Other forms of angina pectoris: Secondary | ICD-10-CM

## 2020-03-09 DIAGNOSIS — I471 Supraventricular tachycardia: Secondary | ICD-10-CM | POA: Diagnosis not present

## 2020-03-09 DIAGNOSIS — E7800 Pure hypercholesterolemia, unspecified: Secondary | ICD-10-CM | POA: Diagnosis not present

## 2020-03-09 MED ORDER — METOPROLOL TARTRATE 100 MG PO TABS: ORAL_TABLET | ORAL | 0 refills | Status: DC

## 2020-03-09 NOTE — Patient Instructions (Addendum)
Medication Instructions:  Continue same medications   Lab Work: Cmet,Lipid Panel to be done 1 week before Coronary CT  Lab order enclosed   Testing/Procedures: Coronary CT  Will be scheduled after approved by insurance   Follow instructions below   Follow-Up: At [facility], you and your health needs are our priority.  As part of our continuing mission to provide you with exceptional heart care, we have created designated Provider Care Teams.  These Care Teams include your primary Cardiologist (physician) and Advanced Practice Providers (APPs -  Physician Assistants and Nurse Practitioners) who all work together to provide you with the care you need, when you need it.  We recommend signing up for the patient portal called "MyChart".  Sign up information is provided on this After Visit Summary.  MyChart is used to connect with patients for Virtual Visits (Telemedicine).  Patients are able to view lab/test results, encounter notes, upcoming appointments, etc.  Non-urgent messages can be sent to your provider as well.   To learn more about what you can do with MyChart, go to [email or web address].    Your next appointment:  To Be Determined after test   The format for your next appointment:  Office   Provider:  Dr.Jordan        Your cardiac CT will be scheduled at one of the below locations:   [facility] [address] [place], [place] [address] [phone]  [facility] [address] [place], [place] [address] [phone]  If scheduled at [facility], please arrive at the [facility] main entrance of [facility] 30 minutes prior to test start time. Proceed to the [facility] Radiology Department (first floor) to check-in and test prep.  If scheduled at [facility], please arrive 15 mins early for check-in and test prep.  Please follow these instructions  carefully (unless otherwise directed):    On the Night Before the Test: . Be sure to Drink plenty of water. . Do not consume any caffeinated/decaffeinated beverages or chocolate 12 hours prior to your test. . Do not take any antihistamines 12 hours prior to your test.   On the Day of the Test: . Drink plenty of water. Do not drink any water within one hour of the test. . Do not eat any food 4 hours prior to the test. . You may take your regular medications prior to the test.  . Take metoprolol 100 mg two hours prior to test. . FEMALES- please wear underwire-free bra if available          After the Test: . Drink plenty of water. . After receiving IV contrast, you may experience a mild flushed feeling. This is normal. . On occasion, you may experience a mild rash up to 24 hours after the test. This is not dangerous. If this occurs, you can take Benadryl 25 mg and increase your fluid intake. . If you experience trouble breathing, this can be serious. If it is severe call 911 IMMEDIATELY. If it is mild, please call our office.    Once we have confirmed authorization from your insurance company, we will call you to set up a date and time for your test. Based on how quickly your insurance processes prior authorizations requests, please allow up to 4 weeks to be contacted for scheduling your Cardiac CT appointment. Be advised that routine Cardiac CT appointments could be scheduled as many as 8 weeks  after your provider has ordered it.  For non-scheduling related questions, please contact the cardiac imaging nurse navigator should you have any questions/concerns: Marchia Bond, Cardiac Imaging Nurse Navigator Burley Saver, Interim Cardiac Imaging Nurse [facility] [facility] Direct Office Dial: [phone]   For scheduling needs, including cancellations and rescheduling, please call [place], [phone].

## 2020-03-10 DIAGNOSIS — Z85828 Personal history of other malignant neoplasm of skin: Secondary | ICD-10-CM | POA: Diagnosis not present

## 2020-03-10 DIAGNOSIS — C44612 Basal cell carcinoma of skin of right upper limb, including shoulder: Secondary | ICD-10-CM | POA: Diagnosis not present

## 2020-03-10 DIAGNOSIS — L821 Other seborrheic keratosis: Secondary | ICD-10-CM | POA: Diagnosis not present

## 2020-03-10 DIAGNOSIS — C4441 Basal cell carcinoma of skin of scalp and neck: Secondary | ICD-10-CM | POA: Diagnosis not present

## 2020-03-10 DIAGNOSIS — D225 Melanocytic nevi of trunk: Secondary | ICD-10-CM | POA: Diagnosis not present

## 2020-03-10 DIAGNOSIS — L814 Other melanin hyperpigmentation: Secondary | ICD-10-CM | POA: Diagnosis not present

## 2020-03-10 DIAGNOSIS — C4401 Basal cell carcinoma of skin of lip: Secondary | ICD-10-CM | POA: Diagnosis not present

## 2020-03-10 DIAGNOSIS — D2371 Other benign neoplasm of skin of right lower limb, including hip: Secondary | ICD-10-CM | POA: Diagnosis not present

## 2020-03-10 DIAGNOSIS — C44519 Basal cell carcinoma of skin of other part of trunk: Secondary | ICD-10-CM | POA: Diagnosis not present

## 2020-03-28 ENCOUNTER — Telehealth (HOSPITAL_COMMUNITY): Payer: Self-pay | Admitting: *Deleted

## 2020-03-28 NOTE — Telephone Encounter (Signed)
Opened in error

## 2020-03-28 NOTE — Telephone Encounter (Signed)
Attempted to call patient regarding upcoming cardiac CT appointment. Left message on voicemail with name and callback number  Peytin Dechert RN Navigator Cardiac Imaging  Heart [facility] Vascular Services [phone] Office [phone] Cell  

## 2020-03-28 NOTE — Telephone Encounter (Signed)
Pt returning call regarding upcoming cardiac imaging study; pt verbalizes understanding of appt date/time, parking situation and where to check in, pre-test NPO status and medications ordered, and verified current allergies; name and call back number provided for further questions should they arise  Johanne Mcglade RN Navigator Cardiac Imaging [facility] [phone] office [phone] cell  

## 2020-03-30 ENCOUNTER — Ambulatory Visit (HOSPITAL_COMMUNITY)
Admission: RE | Admit: 2020-03-30 | Discharge: 2020-03-30 | Disposition: A | Discharge status: Home / Self Care | Payer: Medicare Other | Source: Ambulatory Visit | Attending: Cardiology | Admitting: Cardiology

## 2020-03-30 ENCOUNTER — Other Ambulatory Visit: Payer: Self-pay

## 2020-03-30 ENCOUNTER — Encounter (HOSPITAL_COMMUNITY): Payer: Self-pay

## 2020-03-30 DIAGNOSIS — R072 Precordial pain: Secondary | ICD-10-CM | POA: Insufficient documentation

## 2020-03-30 MED ORDER — IOHEXOL 350 MG/ML SOLN
80.0000 mL | Freq: Once | INTRAVENOUS | Status: AC | PRN
  Administered 2020-03-30: 80 mL via INTRAVENOUS

## 2020-03-30 MED ORDER — NITROGLYCERIN 0.4 MG SL SUBL
SUBLINGUAL_TABLET | SUBLINGUAL | Status: AC
  Filled 2020-03-30: qty 2

## 2020-03-30 MED ORDER — NITROGLYCERIN 0.4 MG SL SUBL: 0.8000 mg | SUBLINGUAL_TABLET | Freq: Once | SUBLINGUAL | Status: DC

## 2020-03-30 NOTE — Progress Notes (Signed)
Pt tolerated exam without incident; pt denies lightheadedness or dizziness; pt ambulatory to lobby steady gait noted  

## 2020-04-04 DIAGNOSIS — J029 Acute pharyngitis, unspecified: Secondary | ICD-10-CM | POA: Diagnosis not present

## 2020-04-04 DIAGNOSIS — U071 COVID-19: Secondary | ICD-10-CM | POA: Diagnosis not present

## 2020-04-12 DIAGNOSIS — E7800 Pure hypercholesterolemia, unspecified: Secondary | ICD-10-CM | POA: Diagnosis not present

## 2020-04-12 DIAGNOSIS — I208 Other forms of angina pectoris: Secondary | ICD-10-CM | POA: Diagnosis not present

## 2020-04-12 DIAGNOSIS — I471 Supraventricular tachycardia: Secondary | ICD-10-CM | POA: Diagnosis not present

## 2020-04-12 DIAGNOSIS — R072 Precordial pain: Secondary | ICD-10-CM | POA: Diagnosis not present

## 2020-04-13 LAB — COMPREHENSIVE METABOLIC PANEL
ALT: 19 IU/L (ref 0–32)
AST: 13 IU/L (ref 0–40)
Albumin/Globulin Ratio: 1.8 (ref 1.2–2.2)
Albumin: 4.2 g/dL (ref 3.8–4.8)
Alkaline Phosphatase: 97 IU/L (ref 44–121)
BUN/Creatinine Ratio: 17 (ref 12–28)
BUN: 15 mg/dL (ref 8–27)
Bilirubin Total: 0.4 mg/dL (ref 0.0–1.2)
CO2: 23 mmol/L (ref 20–29)
Calcium: 9.3 mg/dL (ref 8.7–10.3)
Chloride: 105 mmol/L (ref 96–106)
Creatinine, Ser: 0.9 mg/dL (ref 0.57–1.00)
GFR calc Af Amer: 77 mL/min/{1.73_m2} (ref >59)
GFR calc non Af Amer: 66 mL/min/{1.73_m2} (ref >59)
Globulin, Total: 2.3 g/dL (ref 1.5–4.5)
Glucose: 83 mg/dL (ref 65–99)
Potassium: 5.2 mmol/L (ref 3.5–5.2)
Sodium: 144 mmol/L (ref 134–144)
Total Protein: 6.5 g/dL (ref 6.0–8.5)

## 2020-04-13 LAB — LIPID PANEL
Chol/HDL Ratio: 4.7 ratio — ABNORMAL HIGH (ref 0.0–4.4)
Cholesterol, Total: 200 mg/dL — ABNORMAL HIGH (ref 100–199)
HDL: 43 mg/dL (ref >39)
LDL Chol Calc (NIH): 134 mg/dL — ABNORMAL HIGH (ref 0–99)
Triglycerides: 128 mg/dL (ref 0–149)
VLDL Cholesterol Cal: 23 mg/dL (ref 5–40)

## 2020-04-19 ENCOUNTER — Other Ambulatory Visit: Payer: Self-pay

## 2020-04-19 ENCOUNTER — Ambulatory Visit (INDEPENDENT_AMBULATORY_CARE_PROVIDER_SITE_OTHER): Payer: Medicare Other | Admitting: Pharmacist Clinician (PhC)/ Clinical Pharmacy Specialist

## 2020-04-19 ENCOUNTER — Telehealth: Payer: Self-pay

## 2020-04-19 VITALS — BP 126/82 | HR 71 | Resp 14 | Ht 69.0 in | Wt 178.0 lb

## 2020-04-19 DIAGNOSIS — G720 Drug-induced myopathy: Secondary | ICD-10-CM

## 2020-04-19 DIAGNOSIS — T380X5A Adverse effect of glucocorticoids and synthetic analogues, initial encounter: Secondary | ICD-10-CM | POA: Diagnosis not present

## 2020-04-19 DIAGNOSIS — E7800 Pure hypercholesterolemia, unspecified: Secondary | ICD-10-CM

## 2020-04-19 MED ORDER — REPATHA SURECLICK 140 MG/ML ~~LOC~~ SOAJ: 140.0000 mg | SUBCUTANEOUS | 11 refills | Status: DC

## 2020-04-19 NOTE — Assessment & Plan Note (Addendum)
Patient with ASCVD and hyperlipidemia, LDL not to goal of < 70.  She has been intolerant of multiple statin drugs in the past.  Reviewed options for lowering LDL cholesterol, including ezetimibe, PCSK-9 inhibitors and bempedoic acid.  Discussed mechanisms of action, dosing, side effects and potential decreases in LDL cholesterol.  Answered all patient questions.  Based on this information, patient would prefer to start Beth Schmidt We will start paperwork to get Repatha 140 mg covered by insurance.  Once approved, she will use every 2 weeks and repeat lipid labs after 4-5 doses.

## 2020-04-19 NOTE — Progress Notes (Signed)
**Note De-Identified Beth Schmidt** 04/19/2020 Beth Schmidt [birth year] [id number]   HPI:  Beth Schmidt is a 65 y.o. female patient of Dr Beth Schmidt, who presents today for a lipid clinic evaluation.   Her medical history is significant for ASCVD with coronary calcium score showing her to be in the 62nd percentile for age/sex matched controls.   She also has history of paroxysmal atrial fibrillation that has been refractory to multiple medications.  Because her CHADS2-VASc score is only 2 (age, female), she is currently only taking daily aspirin.     Current Medications: none  Cholesterol Goals: LDL < 70   Intolerant/previously tried: atorvastatin, rosuvastatin, pravastatin - all caused myalgias  Family history: mother CAD, MI, died from stroke at 48; father with multiple MI, first before age 9,  died at 46; 2 siblings, no known issues, 1 son no known issues, works for [employer], occupational  Diet: mix of home/eat out; does intermittent fasting (16/8); avoids fried foods; no fast food, eats mostly baked meats; not much for red meat, more chicken, some pork; vegetables mostly fresh, some canned  Exercise:  Walk most day 1-2 miles, varies by weather  Labs: 2/22:  TC 200, TG 128, HDL 43, LDL 134   Current Outpatient Medications  Medication Sig Dispense Refill  . APPLE CIDER VINEGAR PO Take 1 capsule by mouth daily.    Marland Kitchen aspirin EC 81 MG tablet Take 81 mg by mouth daily. Swallow whole.    Beth Schmidt 575 MG/5ML SYRP Take by mouth.    . naproxen (NAPROSYN) 500 MG tablet TAKE 1 TABLET BY MOUTH TWICE DAILY WITH A MEAL (Patient taking differently: Take 500 mg by mouth as needed (for knee pain).) 90 tablet 0   No current facility-administered medications for this visit.    Allergies  Allergen Reactions  . Crestor [Rosuvastatin]     myalgia  . Lipitor [Atorvastatin]     myalgia    Past Medical History:  Diagnosis Date  . Anxiety   . Atrial fibrillation   . Hyperlipidemia   . Palpitations   . Rheumatic fever   . SVT  (supraventricular tachycardia) (HCC)     Blood pressure 126/82, pulse 71, resp. rate 14, height 5\' 9"  (1.753 m), weight 178 lb (80.7 kg), SpO2 99 %.   HYPERLIPIDEMIA Patient with ASCVD and hyperlipidemia, LDL not to goal of < 70.  She has been intolerant of multiple statin drugs in the past.  Reviewed options for lowering LDL cholesterol, including ezetimibe, PCSK-9 inhibitors and bempedoic acid.  Discussed mechanisms of action, dosing, side effects and potential decreases in LDL cholesterol.  Answered all patient questions.  Based on this information, patient would prefer to start [place] We will start paperwork to get Repatha 140 mg covered by insurance.  Once approved, she will use every 2 weeks and repeat lipid labs after 4-5 doses.     Beth Schmidt PharmD CPP [facility] [address] [place], [place] [address] [phone]

## 2020-04-19 NOTE — Patient Instructions (Addendum)
Your Results:             Your most recent labs Goal  Total Cholesterol 200 < 200  Triglycerides 128 < 150  HDL (happy/good cholesterol) 43 > 40  LDL (lousy/bad cholesterol 134 < 70     Medication changes:  We will start the paperwork to see if Repatha will be covered by your insurance.  If approved, [place] Silos will help to get you covered with patient assistance.    Try increasing your oatmeal and almond consumption, these help to lower cholesterol naturally.   If we cannot get the [place] covered, we will try prescription ezetimibe 10 gm once daily and Cholestoff (by Naure Made) - available over-the-counter at your local pharmacy.    Lab orders:  We will repeat labs after 3 months of treatment.  Patient Assistance:  The [employer] foundation offers assistance to help pay for medication copays.  They will cover copays for all cholesterol lowering meds, including statins, fibrates, omega-3 oils, ezetimibe, Repatha, Praluent, Nexletol, Nexlizet.  The cards are usually good for $2,500 or 12 months, whichever comes first. 1. Go to healthwellfoundation.org 2. Click on "Apply Now" 3. Answer questions as to whom is applying (patient or representative) 4. Your disease fund will be "hypercholesterolemia - Medicare access" 5. They will ask questions about finances and which medications you are taking for cholesterol 6. When you submit, the approval is usually within minutes.  You will need to print the card information from the site 7. You will need to show this information to your pharmacy, they will bill your Medicare Part D plan first -then bill [employer] --for the copay.   You can also call them at [phone], although the hold times can be quite long.   Thank you for choosing [facility]

## 2020-04-19 NOTE — Telephone Encounter (Signed)
Called and spoke w/pt stated to start repatha, rx sent, pt instructed to call back if unaffordable and to complete fasting labs after 4th dose (ordered), pt voiced understanding

## 2020-05-13 DIAGNOSIS — S83242A Other tear of medial meniscus, current injury, left knee, initial encounter: Secondary | ICD-10-CM | POA: Diagnosis not present

## 2020-05-13 DIAGNOSIS — M1712 Unilateral primary osteoarthritis, left knee: Secondary | ICD-10-CM | POA: Diagnosis not present

## 2020-06-03 ENCOUNTER — Telehealth: Payer: Self-pay

## 2020-06-03 NOTE — Telephone Encounter (Signed)
Received a message from Beth Schmidt [facility]) [phone] that the patient qualified for Diagnosis Verification and needs you to call her and verify diagnosis for medication.

## 2020-06-08 DIAGNOSIS — E7800 Pure hypercholesterolemia, unspecified: Secondary | ICD-10-CM | POA: Diagnosis not present

## 2020-06-08 LAB — HEPATIC FUNCTION PANEL
ALT: 32 IU/L (ref 0–32)
AST: 32 IU/L (ref 0–40)
Albumin: 4.2 g/dL (ref 3.8–4.8)
Alkaline Phosphatase: 85 IU/L (ref 44–121)
Bilirubin Total: 0.5 mg/dL (ref 0.0–1.2)
Bilirubin, Direct: 0.14 mg/dL (ref 0.00–0.40)
Total Protein: 6.2 g/dL (ref 6.0–8.5)

## 2020-06-08 LAB — LIPID PANEL
Chol/HDL Ratio: 2.3 ratio (ref 0.0–4.4)
Cholesterol, Total: 146 mg/dL (ref 100–199)
HDL: 63 mg/dL (ref >39)
LDL Chol Calc (NIH): 61 mg/dL (ref 0–99)
Triglycerides: 127 mg/dL (ref 0–149)
VLDL Cholesterol Cal: 22 mg/dL (ref 5–40)

## 2020-06-08 NOTE — Telephone Encounter (Signed)
Printed form off and will have md sign when back in office

## 2020-06-08 NOTE — Telephone Encounter (Signed)
Received a call from [employer] on the [address] coumadin line. Looks like this is the second time they have called and it has not been addressed. Can fax diagnosis verification form back to [employer]. The form can be found at   [email or web address]  Since it needs signature from MD- will request that NL staff address

## 2020-06-15 ENCOUNTER — Encounter: Payer: Self-pay | Admitting: Orthopaedic Surgery

## 2020-06-15 ENCOUNTER — Ambulatory Visit: Payer: Medicare Other | Admitting: Orthopaedic Surgery

## 2020-06-15 DIAGNOSIS — M25562 Pain in left knee: Secondary | ICD-10-CM

## 2020-06-15 NOTE — Progress Notes (Signed)
Office Visit Note   Patient: Beth Schmidt           Date of Birth: 25-Sep-1955           MRN: [id number] Visit Date: 06/15/2020              Requested by: Donald Prose, MD [address] [place],  [place] [address] PCP: Donald Prose, MD   Assessment & Plan: Visit Diagnoses:  1. Acute pain of left knee     Plan: I was able to aspirate about 25 cc of fluid from her left knee.  Given the knee being still in a locked position as well as her limp combined with the effusion and a positive Murray's exam to the medial compartment, a MRI is warranted of the left knee to rule out a flap tear of the meniscus as well as assess the cartilage on the medial compartment of her knee.  She will continue to be careful of his knee.  She can increase her Celebrex to twice a day.  We will see her back in follow-up after the MRI of her left knee.  All questions and concerns were answered and addressed.  Follow-Up Instructions: Return for 1 week post-op.   Orders:  No orders of the defined types were placed in this encounter.  No orders of the defined types were placed in this encounter.     Procedures: No procedures performed   Clinical Data: No additional findings.   Subjective: No chief complaint on file. The patient comes in today with acute injury to her left knee.  She is 65 years old and very active.  She does have a history of bilateral knee arthroscopies years ago.  She has had a left knee that is been hurting her and swollen since her knee hit the ground when she was playing with her grandson.  She had a twisting injury as well.  She has not been able to fully flex her knee since then.  She went to [facility] urgent care and they obtain x-rays of the knee showing some arthritic change of the knee.  They appropriately place a steroid injection in her knee as well.  She has been on Celebrex once a day since then.  She still walking with a limp and still has some fullness and  tightness in her left knee and the inability get her fully flexed or fully extended.  She is not a diabetic.  She denies any other acute change in her medical status and says her knee has not hurt her in many years and this is an isolated acute injury.  HPI  Review of Systems She currently denies any headache, chest pain, shortness of breath, fever, chills, nausea, vomiting  Objective: Vital Signs: There were no vitals taken for this visit.  Physical Exam She is alert and orient x3 and in no acute distress Ortho Exam Examination of her left knee does show a moderate effusion.  She lacks full extension by few degrees and is very painful to flex her past 9 degrees.  She has posterior joint space pain as well as a positive McMurray's exam to the medial compartment. Specialty Comments:  No specialty comments available.  Imaging: No results found. Imaging studies that accompany her show moderate joint space narrowing of the medial compartment and patellofemoral joint as well as slight varus malalignment.  This is of the left knee.  PMFS History: Patient Active Problem List   Diagnosis  Date Noted  . Cough 09/03/2016  . Acute bronchitis 09/03/2016  . Influenza with respiratory manifestation other than pneumonia 04/19/2016  . HYPOTENSION 11/16/2008  . HYPERLIPIDEMIA 11/12/2008  . ANXIETY, CHRONIC 11/12/2008  . TOBACCO ABUSE 11/12/2008  . MITRAL REGURGITATION, MILD 11/12/2008  . LUNG NODULE 11/12/2008  . LEG CRAMPS 11/12/2008  . FRACTURE, WRIST, LEFT 11/12/2008   Past Medical History:  Diagnosis Date  . Anxiety   . Atrial fibrillation   . Hyperlipidemia   . Palpitations   . Rheumatic fever   . SVT (supraventricular tachycardia) (HCC)     Family History  Problem Relation Age of Onset  . Cardiomyopathy Mother   . Stroke Mother   . Coronary artery disease Father     Past Surgical History:  Procedure Laterality Date  . arthroscopic knee    . CARDIAC CATHETERIZATION  06/01/2005    EF 60%  . CATARACT EXTRACTION, BILATERAL Bilateral 05/2018  . US ECHOCARDIOGRAPHY  05/14/2008   EF 55-60%   Social History   Occupational History  . Not on file  Tobacco Use  . Smoking status: Never Smoker  . Smokeless tobacco: Never Used  Vaping Use  . Vaping Use: Never used  Substance and Sexual Activity  . Alcohol use: No  . Drug use: No  . Sexual activity: Yes

## 2020-06-28 ENCOUNTER — Other Ambulatory Visit: Payer: Self-pay | Admitting: Orthopaedic Surgery

## 2020-06-28 DIAGNOSIS — M25562 Pain in left knee: Secondary | ICD-10-CM

## 2020-06-30 DIAGNOSIS — M25562 Pain in left knee: Secondary | ICD-10-CM | POA: Diagnosis not present

## 2020-07-04 ENCOUNTER — Encounter: Payer: Self-pay | Admitting: Orthopaedic Surgery

## 2020-07-04 ENCOUNTER — Telehealth: Payer: Self-pay

## 2020-07-04 ENCOUNTER — Ambulatory Visit: Payer: Medicare Other | Admitting: Orthopaedic Surgery

## 2020-07-04 ENCOUNTER — Other Ambulatory Visit: Payer: Self-pay

## 2020-07-04 DIAGNOSIS — M1712 Unilateral primary osteoarthritis, left knee: Secondary | ICD-10-CM | POA: Diagnosis not present

## 2020-07-04 DIAGNOSIS — M25562 Pain in left knee: Secondary | ICD-10-CM | POA: Diagnosis not present

## 2020-07-04 NOTE — Progress Notes (Signed)
The patient is a 65 year old female who is following up after having an MRI of her left knee.  She had an acute injury to the left knee with a twisting.  She still has pain with pivoting activities on the medial aspect of her knee only.  She has a remote history of a knee arthroscopy.  She still points the medial compartment of her knee as a source of her pain.  She does get intermittent swelling about left knee.  On today's exam there is only mild effusion of the left knee.  Hip moves well and is ligamentously stable.  There is medial joint line tenderness.  The MRI of her left knee does not show any new meniscal tear.  There is blunting of the meniscus indicative of previous meniscal surgery.  There is also subchondral edema in the medial compartment of her knee especially in the medial femoral condyle with marked thinning of the cartilage.  There is slight thinning of the patellofemoral joint and the lateral compartment.  At this point since she is tried and failed conservative treatment including activity modification, quad strengthening exercises, arthroscopic intervention and even a steroid injection, the next treatment alternative would be considering hyaluronic acid for the left knee.  She is agreement to this treatment plan.  We will order and hopefully get approved hyaluronic acid for the left knee to treat the pain from osteoarthritis especially given the failure of even steroid injection.  We will also try a knee brace to help offload the medial side of the knee in the interim.  All questions and concerns were answered and addressed.  We will call her when we have approval for the hyaluronic acid for her left knee.

## 2020-07-04 NOTE — Telephone Encounter (Signed)
Left knee gel injection ?

## 2020-07-05 NOTE — Telephone Encounter (Signed)
Noted  

## 2020-07-27 ENCOUNTER — Ambulatory Visit: Payer: Medicare Other | Admitting: Orthopaedic Surgery

## 2020-08-03 ENCOUNTER — Ambulatory Visit (HOSPITAL_COMMUNITY)
Admission: RE | Admit: 2020-08-03 | Discharge: 2020-08-03 | Disposition: A | Discharge status: Home / Self Care | Payer: Medicare Other | Source: Ambulatory Visit | Attending: Orthopaedic Surgery | Admitting: Orthopaedic Surgery

## 2020-08-03 ENCOUNTER — Encounter: Payer: Self-pay | Admitting: Orthopaedic Surgery

## 2020-08-03 ENCOUNTER — Other Ambulatory Visit: Payer: Self-pay

## 2020-08-03 ENCOUNTER — Ambulatory Visit: Payer: Medicare Other | Admitting: Orthopaedic Surgery

## 2020-08-03 DIAGNOSIS — M7989 Other specified soft tissue disorders: Secondary | ICD-10-CM

## 2020-08-03 MED ORDER — GABAPENTIN 300 MG PO CAPS: 300.0000 mg | ORAL_CAPSULE | Freq: Every day | ORAL | 0 refills | Status: DC

## 2020-08-03 MED ORDER — ACETAMINOPHEN-CODEINE #3 300-30 MG PO TABS: 1.0000 | ORAL_TABLET | Freq: Three times a day (TID) | ORAL | 0 refills | Status: DC | PRN

## 2020-08-03 NOTE — Progress Notes (Signed)
Lower extremity venous has been completed.   Preliminary results in CV Proc.   Abram Sander 08/03/2020 1:05 PM

## 2020-08-03 NOTE — Progress Notes (Signed)
The patient is someone of seen twice now for an acute left knee injury on top of her chronic injury.  We sent her for an MRI of the left knee that just showed some blunting of the meniscus but edema in the medial femoral condyle the knee nothing that would recommend surgery on but we certainly needed activity modification and she has been on Celebrex twice a day.  She is reporting more leg swelling that is going all the way down to her ankle now as well as some numbness and tingling in that left leg.  She still reports left knee pain as well.  On exam the left calf is swollen.  There is more pitting edema in the leg.  She does have some dependent edema but I am concerned about the potential for DVT.  I am going to try some Tylenol 3 for pain and Neurontin for the numbness and tingling.  We need to set her up for a Doppler ultrasound of the left lower extremity to rule out a DVT.  That should be done today.  We will then let her know the results and where to go from here.  I want her to offload that leg with either crutch or cane or opposite hand and elevated is much as he can during the day.

## 2020-08-17 ENCOUNTER — Other Ambulatory Visit: Payer: Self-pay

## 2020-08-17 ENCOUNTER — Ambulatory Visit (INDEPENDENT_AMBULATORY_CARE_PROVIDER_SITE_OTHER): Payer: Medicare Other | Admitting: Orthopaedic Surgery

## 2020-08-17 ENCOUNTER — Encounter: Payer: Self-pay | Admitting: Orthopaedic Surgery

## 2020-08-17 DIAGNOSIS — M25562 Pain in left knee: Secondary | ICD-10-CM

## 2020-08-17 DIAGNOSIS — M7989 Other specified soft tissue disorders: Secondary | ICD-10-CM

## 2020-08-17 NOTE — Progress Notes (Signed)
The patient comes in for continued follow-up for her left knee.  A MRI of her left knee showed significant edema in the medial femoral condyle and that is mainly where she was hurting with no known trauma.  She is 65 years old and does have some arthritic changes in that knee.  The MRI did not show any meniscal tearing.  We sent her for DVT screen at her last visit because of significant calf swelling and calf pain.  That was negative for DVT and the swelling has gone down.  She still wears a compressive stocking on that left leg.  She is a very active 65 year old female.  She still reports some left ankle swelling as well but overall her pain is decreasing and she is only taken her pain medications about twice since have seen her last.  She states that she knows the activities to avoid and the waist to step to try to keep pain off of her left knee.  On exam she still has pain over the medial femoral condyle to be expected and there is a mild knee joint effusion.  Her calf is soft today.  She has good range of motion of the left knee.  I would like to see her back in 4 weeks given the fact that she did have significant stress response in the medial femoral condyle.  At that visit I would like a standing AP and lateral of her left knee.  Right now, I would still recommend just conservative treatment with time and still wearing compressive stockings due to edema in the leg.

## 2020-08-26 ENCOUNTER — Telehealth: Payer: Self-pay

## 2020-08-26 NOTE — Telephone Encounter (Signed)
Approved for Durolane, left knee. [place] Patient will be responsible for 20% OOP. Co-pay of $30.00 No PA required  Appt. 08/31/2020 with Dr. Ninfa Linden

## 2020-08-31 ENCOUNTER — Ambulatory Visit: Payer: Medicare Other | Admitting: Orthopaedic Surgery

## 2020-09-12 ENCOUNTER — Ambulatory Visit: Payer: Medicare Other | Admitting: Orthopaedic Surgery

## 2020-09-12 ENCOUNTER — Ambulatory Visit (INDEPENDENT_AMBULATORY_CARE_PROVIDER_SITE_OTHER): Payer: Medicare Other

## 2020-09-12 DIAGNOSIS — M1712 Unilateral primary osteoarthritis, left knee: Secondary | ICD-10-CM

## 2020-09-12 DIAGNOSIS — M7989 Other specified soft tissue disorders: Secondary | ICD-10-CM

## 2020-09-12 MED ORDER — SODIUM HYALURONATE 60 MG/3ML IX PRSY
60.0000 mg | PREFILLED_SYRINGE | INTRA_ARTICULAR | Status: AC | PRN
  Administered 2020-09-12: 60 mg via INTRA_ARTICULAR

## 2020-09-12 NOTE — Progress Notes (Signed)
   Procedure Note  Patient: Beth Schmidt             Date of Birth: 04/18/1955           MRN: [id number]             Visit Date: 09/12/2020  Procedures: Visit Diagnoses:  1. Left leg swelling   2. Unilateral primary osteoarthritis, left knee     Large Joint Inj: L knee on 09/12/2020 10:44 AM Indications: diagnostic evaluation and pain Details: 22 G 1.5 in needle, superolateral approach  Arthrogram: No  Medications: 60 mg Sodium Hyaluronate 60 MG/3ML Outcome: tolerated well, no immediate complications Procedure, treatment alternatives, risks and benefits explained, specific risks discussed. Consent was given by the patient. Immediately prior to procedure a time out was called to verify the correct patient, procedure, equipment, support staff and site/side marked as required. Patient was prepped and draped in the usual sterile fashion.   The patient comes in today for an injection with hyaluronic acid in the left knee to treat the pain from osteoarthritis.  This is with Durolane.  She has a lot of medial joint line tenderness.  X-rays today also confirm medial compartment osteoarthritis with near bone-on-bone wear.  She understands why we are trying this injection today.  There is no effusion.  I did place in the knee without difficulty.  All question concerns were answered and addressed.  She understands it can take 6 weeks or so for this to hopefully help.  If it does not be great she can have this again in 6 months to a year if needed.  Otherwise a steroid injection in 3 months if needed.  All question concerns were answered and addressed.

## 2020-09-14 ENCOUNTER — Ambulatory Visit: Payer: Medicare Other | Admitting: Orthopaedic Surgery

## 2020-10-17 ENCOUNTER — Telehealth: Payer: Self-pay | Admitting: Radiology

## 2020-10-17 NOTE — Telephone Encounter (Signed)
Pt was called and informed. She stated understanding

## 2020-10-17 NOTE — Telephone Encounter (Signed)
Please advise 

## 2020-10-17 NOTE — Telephone Encounter (Signed)
Patient called, stated her right knee is hurting her now, more than the left.  She has been using voltaren gel and blue emu, taking tylenol #3, with some relief.  Going on a trip with a lot of walking.  She wants to know if there is a brace you all have that Beth Schmidt help her?  Please call to discuss/advise, thanks.

## 2021-02-14 ENCOUNTER — Encounter: Payer: Self-pay | Admitting: Orthopaedic Surgery

## 2021-02-14 ENCOUNTER — Ambulatory Visit: Payer: Medicare Other | Admitting: Orthopaedic Surgery

## 2021-02-14 DIAGNOSIS — M25561 Pain in right knee: Secondary | ICD-10-CM | POA: Diagnosis not present

## 2021-02-14 MED ORDER — LIDOCAINE HCL 1 % IJ SOLN
3.0000 mL | INTRAMUSCULAR | Status: AC | PRN
  Administered 2021-02-14: 3 mL

## 2021-02-14 MED ORDER — METHYLPREDNISOLONE ACETATE 40 MG/ML IJ SUSP
40.0000 mg | INTRAMUSCULAR | Status: AC | PRN
  Administered 2021-02-14: 40 mg via INTRA_ARTICULAR

## 2021-02-14 NOTE — Progress Notes (Signed)
Office Visit Note   Patient: Beth Schmidt           Date of Birth: 1955/05/05           MRN: [id number] Visit Date: 02/14/2021              Requested by: Donald Prose, MD [address] [place],  [place] [address] PCP: Donald Prose, MD   Assessment & Plan: Visit Diagnoses:  1. Acute pain of right knee     Plan: Her signs and symptoms are more consistent with potential meniscal injury of the right knee as opposed to arthritic changes.  I did not recommended hyaluronic acid injection for her right knee today but I think she needs a steroid injection and then close follow-up for repeat exam.  My next step for her would be a MRI of the right knee to rule out a meniscal tear if she still has mechanical symptoms.  We will reevaluate in 4 weeks.  Follow-Up Instructions: Return in about 4 weeks (around 03/14/2021), or if symptoms worsen or fail to improve.   Orders:  Orders Placed This Encounter  Procedures   Large Joint Inj    No orders of the defined types were placed in this encounter.     Procedures: Large Joint Inj: R knee on 02/14/2021 8:53 AM Indications: diagnostic evaluation and pain Details: 22 G 1.5 in needle, superolateral approach  Arthrogram: No  Medications: 3 mL lidocaine 1 %; 40 mg methylPREDNISolone acetate 40 MG/ML Outcome: tolerated well, no immediate complications Procedure, treatment alternatives, risks and benefits explained, specific risks discussed. Consent was given by the patient. Immediately prior to procedure a time out was called to verify the correct patient, procedure, equipment, support staff and site/side marked as required. Patient was prepped and draped in the usual sterile fashion.      Clinical Data: No additional findings.   Subjective: Chief Complaint  Patient presents with   Right Knee - Pain  The patient is a 65 year old female who injured her right knee back in [place] when [place] the airport and [place] her  knee.  I have actually placed hyaluronic acid in her left knee before due to significant arthritis in the left knee.  The pain in her right knee that was different and she has been approved surprisingly for a gel shot with Durolane in her right knee today but what she describes more as mechanical symptoms with locking catching after this acute twisting injury to her knee.  She points to the lateral aspect of the knee and the posterior lateral corner of her right knee as a source of her pain.  It was very painful when the accident or event happened when she twisted her knee in the airport.  She has never had surgery on that knee or ever injured her right knee before.  HPI  Review of Systems There is currently listed no fever, chills, nausea, vomiting  Objective: Vital Signs: There were no vitals taken for this visit.  Physical Exam She is alert and orient x3 and in no acute distress Ortho Exam Examination of her right knee shows a positive McMurray's exam to the lateral compartment of the knee.  The knee has good range of motion but is painful in the posterior lateral aspect.  It is ligamentously stable. Specialty Comments:  No specialty comments available.  Imaging: No results found. The previous x-ray of the left knee standing also showed the right knee from  an AP view which showed a well-maintained medial lateral compartment with just slight narrowing compared to the left knee that had varus malalignment and significant narrowing of the medial compartment.  PMFS History: Patient Active Problem List   Diagnosis Date Noted   Cough 09/03/2016   Acute bronchitis 09/03/2016   Influenza with respiratory manifestation other than pneumonia 04/19/2016   HYPOTENSION 11/16/2008   HYPERLIPIDEMIA 11/12/2008   ANXIETY, CHRONIC 11/12/2008   TOBACCO ABUSE 11/12/2008   MITRAL REGURGITATION, MILD 11/12/2008   LUNG NODULE 11/12/2008   LEG CRAMPS 11/12/2008   FRACTURE, WRIST, LEFT 11/12/2008   Past  Medical History:  Diagnosis Date   Anxiety    Atrial fibrillation    Hyperlipidemia    Palpitations    Rheumatic fever    SVT (supraventricular tachycardia) ([place])     Family History  Problem Relation Age of Onset   Cardiomyopathy Mother    Stroke Mother    Coronary artery disease Father     Past Surgical History:  Procedure Laterality Date   arthroscopic knee     CARDIAC CATHETERIZATION  06/01/2005   EF 60%   CATARACT EXTRACTION, BILATERAL Bilateral 05/2018   US ECHOCARDIOGRAPHY  05/14/2008   EF 55-60%   Social History   Occupational History   Not on file  Tobacco Use   Smoking status: Never   Smokeless tobacco: Never  Vaping Use   Vaping Use: Never used  Substance and Sexual Activity   Alcohol use: No   Drug use: No   Sexual activity: Yes

## 2021-03-03 ENCOUNTER — Other Ambulatory Visit: Payer: Self-pay

## 2021-03-03 ENCOUNTER — Telehealth: Payer: Self-pay

## 2021-03-03 MED ORDER — REPATHA SURECLICK 140 MG/ML ~~LOC~~ SOAJ: 140.0000 mg | SUBCUTANEOUS | 11 refills | Status: DC

## 2021-03-03 NOTE — Telephone Encounter (Signed)
Called and lmom pt that they were approved for the grant and to call us back to retrieve the information to take to pharmacy: Pharmacy Card CARD NO. [id number]   CARD STATUS Active   BIN [id number]   PCN [id number]   PC GROUP [id number]   HELP DESK [phone]   PROVIDER PDMI   PROCESSOR PDMI

## 2021-03-14 ENCOUNTER — Ambulatory Visit: Payer: Medicare Other | Admitting: Orthopaedic Surgery

## 2021-04-17 ENCOUNTER — Other Ambulatory Visit: Payer: Self-pay | Admitting: Orthopaedic Surgery

## 2021-04-17 ENCOUNTER — Telehealth: Payer: Self-pay | Admitting: Radiology

## 2021-04-17 DIAGNOSIS — M1711 Unilateral primary osteoarthritis, right knee: Secondary | ICD-10-CM

## 2021-04-17 MED ORDER — NAPROXEN 500 MG PO TABS: ORAL_TABLET | ORAL | 2 refills | Status: DC

## 2021-04-17 NOTE — Telephone Encounter (Signed)
Patient states she is almost our of naprosyn, and wondered if you would refill for her?  I don't think you prescribed originally.  She is doing better overall with her knees, just wants to make sure she has this on hand if needed.  Thanks.

## 2021-06-23 ENCOUNTER — Other Ambulatory Visit: Payer: Self-pay | Admitting: Orthopaedic Surgery

## 2021-06-23 DIAGNOSIS — M25562 Pain in left knee: Secondary | ICD-10-CM

## 2021-08-17 IMAGING — CR DG CHEST 2V
2 series · 2 of 2 positions shown · non-contrast
Comparison: None.

CLINICAL DATA: Chest pain

EXAM:
CHEST - 2 VIEW

[chest pa]
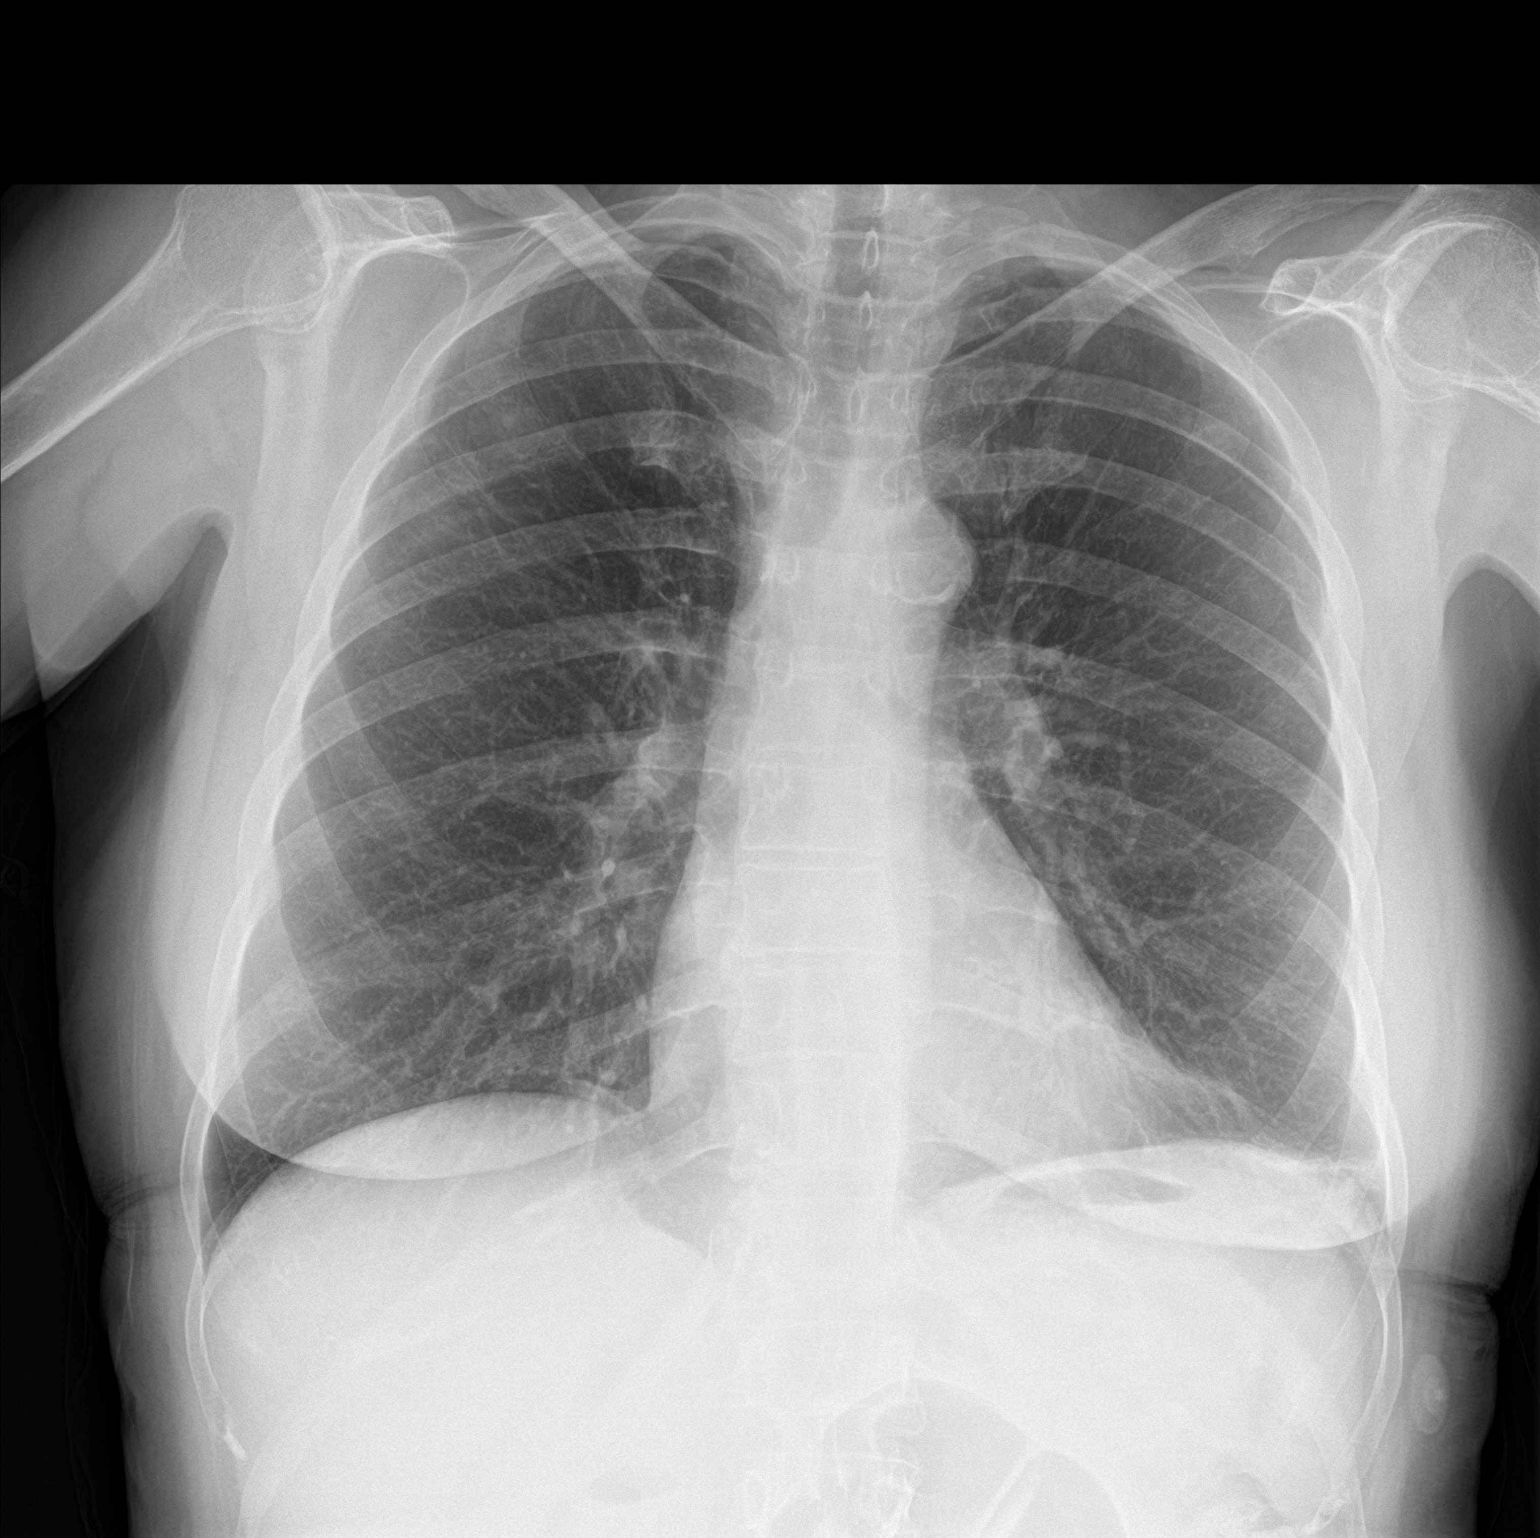

[chest lat]
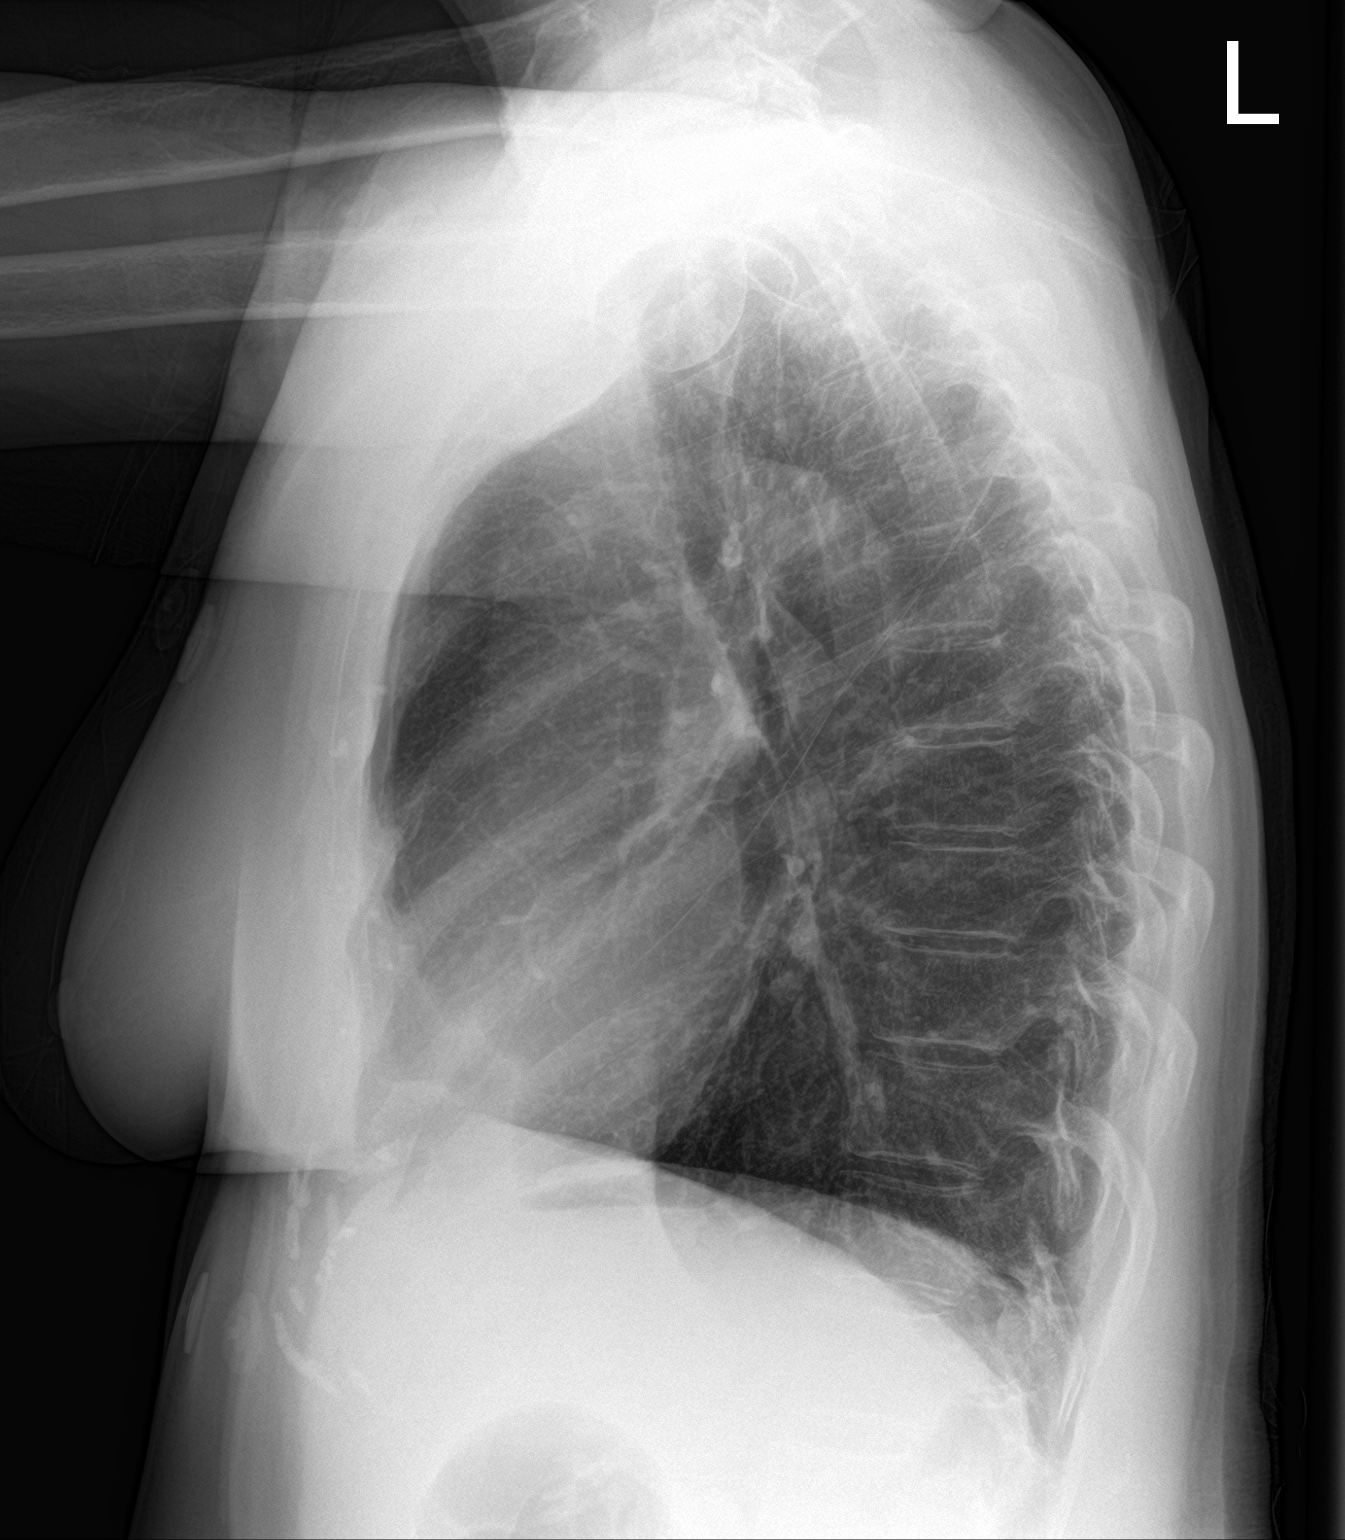

[2 of 2 positions shown; findings below may reference images not displayed]

FINDINGS: The heart size and mediastinal contours are within normal limits.
Aortic knob calcifications are seen. Both lungs are clear. The
visualized skeletal structures are unremarkable.
IMPRESSION: No active cardiopulmonary disease.

## 2021-09-14 DIAGNOSIS — Z1231 Encounter for screening mammogram for malignant neoplasm of breast: Secondary | ICD-10-CM | POA: Diagnosis not present

## 2021-09-14 DIAGNOSIS — Z6824 Body mass index (BMI) 24.0-24.9, adult: Secondary | ICD-10-CM | POA: Diagnosis not present

## 2021-11-09 DIAGNOSIS — C4401 Basal cell carcinoma of skin of lip: Secondary | ICD-10-CM | POA: Diagnosis not present

## 2021-11-09 DIAGNOSIS — C44519 Basal cell carcinoma of skin of other part of trunk: Secondary | ICD-10-CM | POA: Diagnosis not present

## 2021-11-09 DIAGNOSIS — Z85828 Personal history of other malignant neoplasm of skin: Secondary | ICD-10-CM | POA: Diagnosis not present

## 2021-11-09 DIAGNOSIS — C44712 Basal cell carcinoma of skin of right lower limb, including hip: Secondary | ICD-10-CM | POA: Diagnosis not present

## 2021-11-09 DIAGNOSIS — D2372 Other benign neoplasm of skin of left lower limb, including hip: Secondary | ICD-10-CM | POA: Diagnosis not present

## 2021-11-09 DIAGNOSIS — D2371 Other benign neoplasm of skin of right lower limb, including hip: Secondary | ICD-10-CM | POA: Diagnosis not present

## 2021-11-09 DIAGNOSIS — L910 Hypertrophic scar: Secondary | ICD-10-CM | POA: Diagnosis not present

## 2021-12-07 DIAGNOSIS — C4401 Basal cell carcinoma of skin of lip: Secondary | ICD-10-CM | POA: Diagnosis not present

## 2022-02-02 ENCOUNTER — Telehealth: Payer: Self-pay | Admitting: Pharmacist

## 2022-02-02 NOTE — Telephone Encounter (Signed)
Patient called. Needs renewal of [place].  Will complete application.    Patient overdue for visit with Dr [place]. Will route to scheduling.

## 2022-03-13 ENCOUNTER — Other Ambulatory Visit: Payer: Self-pay | Admitting: Cardiology

## 2022-03-13 DIAGNOSIS — E7800 Pure hypercholesterolemia, unspecified: Secondary | ICD-10-CM

## 2022-03-16 NOTE — Telephone Encounter (Signed)
Patient called. [employer] grant expires 03/20/22 and she still has 1600 dollars reminaing. Gave patient billing information over the phone with instructions to give to pharmacy. Will reenroll patient on 03/20/22

## 2022-03-20 ENCOUNTER — Telehealth: Payer: Self-pay | Admitting: Pharmacist

## 2022-03-20 NOTE — Telephone Encounter (Signed)
Called patient and LMOM

## 2022-03-20 NOTE — Telephone Encounter (Signed)
Re enrolled patient in [employer].  Pharmacy Card CARD NO. [id number]   CARD STATUS Active   BIN [id number]   PCN [id number]   PC GROUP [id number]

## 2022-03-20 NOTE — Telephone Encounter (Signed)
-----  Message from Rollen Sox, [facility] sent at 03/16/2022 11:56 AM EST ----- Regarding: [employer] Reenroll in [employer]

## 2022-03-23 ENCOUNTER — Other Ambulatory Visit (HOSPITAL_COMMUNITY): Payer: Self-pay

## 2022-04-01 NOTE — Progress Notes (Unsigned)
Cardiology Office Note:    Date:  04/01/2022   ID:  Beth Schmidt, DOB 1955/05/20, MRN [id number]  PCP:  Donald Prose, MD  Cardiologist:  None  Electrophysiologist:  None   Referring MD: Donald Prose, MD   Chief Complaint: follow-up of CAD  History of Present Illness:    Beth Schmidt is a 67 y.o. female with a history of mild non-obstructive CAD noted on coronary CTA in 03/2020, paroxysmal atrial fibrillation not on anticoagulation, paroxysmal atrial tachycardia, hyperlipidemia intolerant to statins, and rheumatic fever who is followed by Dr. Martinique and presents today for routine follow-up.   Patient was remotely seen by Dr. Mare Ferrari and Dr. Doreatha Lew and now follows with Dr. Martinique. Cardiac catheterization in 2007 showed normal coronaries. She has a history of paroxysmal atrial fibrillation and atrial tachycardia and previously saw Dr. Caryl Comes for this. She has been refractory to multiple medications including Flecainide, Propafenone, and Multaq. At one point there was consideration for Tikosyn but this was never done. Becaues her CHA2DS2-VASc score is only 2 (age and female), patient has not been on any anticoagulation and is only on Aspirin daily. Patient was referred to Dr. Martinique in 03/2020 (after not having been seen by Cardiology in several years) after recent ED visit for chest pain. EKG in the ED showed no acute changes and troponin was negative. She continued to have progressive symptoms following ED visit. Coronary CTA was ordered for further evaluation and showed a coronary calcium score of 19 (62nd percentile for age and sex) with mild non-obstructive CAD (25-49%). She was see in our [facility] in 04/2020 given intolerance to multiple statins and was started on Repatha.   Patient presents today for follow-up.   Non-Obstructive CAD Coronary CTA in 03/2020 showed a coronary calcium score of 19 (62nd percentile for age and sex) with mild non-obstructive CAD (25-49%). - No  chest pain.  - Continue Aspirin '81mg'$  daily.  - Intolerant to multiple statins. Continue Repatha.   Paroxysmal Atrial Fibrillation Paroxysmal Atrial Tachycardia Patient has a history of paroxysmal atrial fibrillation and atrial tachycardia. She has been refractory to multiple medications including Flecainide, Propafenone, and Multaq. At one point there was consideration for Tikosyn but this was never done. - Maintaining sinus rhythm.  - Not requiring any AV nodal agents.  - CHA2DS2-VASc = 2 (age and gender). Therefore, patient has not been on anticoagulation and is only on Aspirin '81mg'$  daily.   Hyperlipidemia Most recent lipid panel in 06/2020: Total Cholesterol 146, Triglycerides 127, HDL 63, LDL 61. LDL goal <70 given CAD. - Intolerant to multiple statins in the past. Continue Repatha '140mg'$  every 2 weeks.    Past Medical History:  Diagnosis Date   Anxiety    Atrial fibrillation    Hyperlipidemia    Palpitations    Rheumatic fever    SVT (supraventricular tachycardia) [facility])     Past Surgical History:  Procedure Laterality Date   arthroscopic knee     CARDIAC CATHETERIZATION  06/01/2005   EF 60%   CATARACT EXTRACTION, BILATERAL Bilateral 05/2018   US ECHOCARDIOGRAPHY  05/14/2008   EF 55-60%    Current Medications: No outpatient medications have been marked as taking for the 04/12/22 encounter (Appointment) with Darreld Mclean, PA-C.     Allergies:   Crestor [rosuvastatin] and Lipitor [atorvastatin]   Social History   Socioeconomic History   Marital status: Married    Spouse name: Not on file   Number of children: 1  Years of education: Not on file   Highest education level: Not on file  Occupational History   Not on file  Tobacco Use   Smoking status: Never   Smokeless tobacco: Never  Vaping Use   Vaping Use: Never used  Substance and Sexual Activity   Alcohol use: No   Drug use: No   Sexual activity: Yes  Other Topics Concern   Not on file  Social  History Narrative   Not on file   Social Determinants of Health   Financial Resource Strain: Low Risk  (09/15/2018)   Overall Financial Resource Strain (CARDIA)    Difficulty of Paying Living Expenses: Not hard at all  Food Insecurity: No Food Insecurity (09/15/2018)   Hunger Vital Sign    Worried About Running Out of Food in the Last Year: Never true    [place] in the Last Year: Never true  Transportation Needs: No Transportation Needs (09/15/2018)   PRAPARE - Hydrologist (Medical): No    Lack of Transportation (Non-Medical): No  Physical Activity: Inactive (09/15/2018)   Exercise Vital Sign    Days of Exercise per Week: 0 days    Minutes of Exercise per Session: 0 min  Stress: No Stress Concern Present (09/15/2018)   [place]    Feeling of Stress : Only a little  Social Connections: Moderately Integrated (09/15/2018)   Social Connection and Isolation Panel [NHANES]    Frequency of Communication with Friends and Family: Three times a week    Frequency of Social Gatherings with Friends and Family: Twice a week    Attends Religious Services: More than 4 times per year    Active Member of [employer] or Organizations: No    Attends Music therapist: Never    Marital Status: Married     Family History: The patient's family history includes Cardiomyopathy in her mother; Coronary artery disease in her father; Stroke in her mother.  ROS:   Please see the history of present illness.     EKGs/Labs/Other Studies Reviewed:    The following studies were reviewed:  Coronary CTA 03/30/2020: Impressions; 1. Coronary calcium score of 19. This was 62nd percentile for age and sex matched control. 2. Nonobstructive CAD 3. Mixed plaque in the proximal LAD causes mild (25-49%) stenosis, calcified plaque in the mid LAD causes minimal (0-24%) stenosis, and noncalcified plaque in the  mid LAD causes minimal (0-24%) stenosis.   CAD-RADS 2. Mild non-obstructive CAD (25-49%). Consider non-atherosclerotic causes of chest pain. Consider preventive therapy and risk factor modification.   EKG:  EKG ordered today. EKG personally reviewed and demonstrates ***.  Recent Labs: No results found for requested labs within last 365 days.  Recent Lipid Panel    Component Value Date/Time   CHOL 146 06/08/2020 0826   TRIG 127 06/08/2020 0826   HDL 63 06/08/2020 0826   CHOLHDL 2.3 06/08/2020 0826   LDLCALC 61 06/08/2020 0826    Physical Exam:    Vital Signs: There were no vitals taken for this visit.    Wt Readings from Last 3 Encounters:  04/19/20 178 lb (80.7 kg)  03/09/20 180 lb (81.6 kg)  03/01/20 170 lb (77.1 kg)     General: 67 y.o. female in no acute distress. HEENT: Normocephalic and atraumatic. Sclera clear. EOMs intact. Neck: Supple. No carotid bruits. No JVD. Heart: *** RRR. Distinct S1 and S2. No murmurs, gallops, or  rubs. Radial and distal pedal pulses 2+ and equal bilaterally. Lungs: No increased work of breathing. Clear to ausculation bilaterally. No wheezes, rhonchi, or rales.  Abdomen: Soft, non-distended, and non-tender to palpation. Bowel sounds present in all 4 quadrants.  MSK: Normal strength and tone for age. *** Extremities: No lower extremity edema.    Skin: Warm and dry. Neuro: Alert and oriented x3. No focal deficits. Psych: Normal affect. Responds appropriately.   Assessment:    No diagnosis found.  Plan:     Disposition: Follow up in ***   Medication Adjustments/Labs and Tests Ordered: Current medicines are reviewed at length with the patient today.  Concerns regarding medicines are outlined above.  No orders of the defined types were placed in this encounter.  No orders of the defined types were placed in this encounter.   There are no Patient Instructions on file for this visit.   Signed, Darreld Mclean, PA-C  04/01/2022  4:27 PM    [facility]

## 2022-04-11 NOTE — Progress Notes (Unsigned)
Cardiology Clinic Note   Patient Name: Beth Schmidt Date of Encounter: 04/12/2022  Primary Care Provider:  Donald Prose, MD Primary Cardiologist:  None  Patient Profile    Beth Schmidt is a 67 y.o. female with a past medical history of nonobstructive CAD per coronary CT, palpitations, mild mitral regurgitation (rheumatic fever as a child), hyperlipidemia who presents to the clinic today for 1 year follow-up of chronic cardiac conditions.  Past Medical History    Past Medical History:  Diagnosis Date   Anxiety    Atrial fibrillation    Hyperlipidemia    Palpitations    Rheumatic fever    SVT (supraventricular tachycardia)    Past Surgical History:  Procedure Laterality Date   arthroscopic knee     CARDIAC CATHETERIZATION  06/01/2005   EF 60%   CATARACT EXTRACTION, BILATERAL Bilateral 05/2018   US ECHOCARDIOGRAPHY  05/14/2008   EF 55-60%    Allergies  Allergies  Allergen Reactions   Crestor [Rosuvastatin]     myalgia   Lipitor [Atorvastatin]     myalgia    History of Present Illness    Beth Schmidt has a past medical history of: Nonobstructive CAD. Coronary CT 03/30/2020: Calcium score of 19 (62nd percentile).  Mixed plaque in proximal LAD, calcified plaque in the mid LAD, and noncalcified plaque in mid LAD. Palpitations. Mild mitral regurgitation. Hyperlipidemia. Lipid panel 06/08/2020: LDL 61, HDL 63, TG 127, total 146.  [name] has long history with cardiology dating back to approximately 2007 with Dr. Mare Ferrari and Dr. Doreatha Lew.  She had a normal LHC at that time.  She was also seen by Dr. Caryl Comes in 2010 for tachypalpitations.  She failed management of atrial tachycardia/PAF with multiple drugs including flecainide, propafenone, and Multaq.  There was a consideration for Tikosyn but that was never done.  Dr. Rayann Heman felt she would not be a candidate for ablation secondary to history of rheumatic heart disease. She was last seen by Dr. Martinique on 03/09/2020  at the request of Dr. Nancy Fetter for cardiovascular risk evaluation after an ER visit for chest pain.  She underwent coronary CT as above.  Dr. Martinique stopped aspirin. She followed with Pharm.[facility]. lipid clinic February 2022.  Today, patient is here alone. She denies shortness of breath or dyspnea on exertion. No chest pain, pressure, or tightness. Denies lower extremity edema, orthopnea, or PND. She stays active during the warmer months by walking regularly. She reports frequent episodes of palpitations daily described as "feeling like my heart is going to beat out of my chest." Episodes last varied lengths of time. She has a long history of palpitations dating back childhood.  She is concerned that she may be going into A-fib, as her husband just got out of the hospital and was diagnosed with A-fib although he did not feel any palpitations.  She denies associated chest pain or shortness of breath with the palpitations.    Home Medications    Current Meds  Medication Sig   Evolocumab (REPATHA SURECLICK) 144 MG/ML SOAJ INJECT 140 MG  SUBCUTANEOUSLY EVERY 14 DAYS   gabapentin (NEURONTIN) 300 MG capsule Take 1 capsule (300 mg total) by mouth at bedtime.   naproxen (NAPROSYN) 500 MG tablet TAKE 1 TABLET BY MOUTH TWICE DAILY WITH A MEAL AS NEEDED FOR PAIN    Family History    Family History  Problem Relation Age of Onset   Cardiomyopathy Mother    Stroke Mother    Coronary artery  disease Father    She indicated that her mother is deceased. She indicated that her father is deceased. She indicated that her sister is alive. She indicated that her brother is alive.   Social History    Social History   Socioeconomic History   Marital status: Married    Spouse name: Not on file   Number of children: 1   Years of education: Not on file   Highest education level: Not on file  Occupational History   Not on file  Tobacco Use   Smoking status: Never   Smokeless tobacco: Never  Vaping Use   Vaping Use:  Never used  Substance and Sexual Activity   Alcohol use: No   Drug use: No   Sexual activity: Yes  Other Topics Concern   Not on file  Social History Narrative   Not on file   Social Determinants of Health   Financial Resource Strain: Low Risk  (09/15/2018)   Overall Financial Resource Strain (CARDIA)    Difficulty of Paying Living Expenses: Not hard at all  Food Insecurity: No Food Insecurity (09/15/2018)   Hunger Vital Sign    Worried About Running Out of Food in the Last Year: Never true    [place] in the Last Year: Never true  Transportation Needs: No Transportation Needs (09/15/2018)   PRAPARE - Hydrologist (Medical): No    Lack of Transportation (Non-Medical): No  Physical Activity: Inactive (09/15/2018)   Exercise Vital Sign    Days of Exercise per Week: 0 days    Minutes of Exercise per Session: 0 min  Stress: No Stress Concern Present (09/15/2018)   [name]    Feeling of Stress : Only a little  Social Connections: Moderately Integrated (09/15/2018)   Social Connection and Isolation Panel [NHANES]    Frequency of Communication with Friends and Family: Three times a week    Frequency of Social Gatherings with Friends and Family: Twice a week    Attends Religious Services: More than 4 times per year    Active Member of [employer] or Organizations: No    Attends Archivist Meetings: Never    Marital Status: Married  Human resources officer Violence: Not At Risk (09/15/2018)   Humiliation, Afraid, Rape, and Kick questionnaire    Fear of Current or Ex-Partner: No    Emotionally Abused: No    Physically Abused: No    Sexually Abused: No     Review of Systems    General:  No chills, fever, night sweats or weight changes.  Cardiovascular:  No chest pain, dyspnea on exertion, edema, orthopnea, paroxysmal nocturnal dyspnea.  Positive for daily palpitations. Dermatological:  No rash, lesions/masses Respiratory: No cough, dyspnea Urologic: No hematuria, dysuria Abdominal:   No nausea, vomiting, diarrhea, bright red blood per rectum, melena, or hematemesis Neurologic:  No visual changes, weakness, changes in mental status. All other systems reviewed and are otherwise negative except as noted above.  Physical Exam    VS:  BP 116/84   Pulse 65   Ht '5\' 9"'$  (1.753 m)   Wt 167 lb 3.2 oz (75.8 kg)   SpO2 98%   BMI 24.69 kg/m  , BMI Body mass index is 24.69 kg/m. GEN:  Well nourished, well developed, in no acute distress. HEENT: Normal. Neck: Supple, no JVD, carotid bruits, or masses. Cardiac: RRR, no murmurs, rubs, or gallops. No clubbing, cyanosis, edema.  Radials/DP/PT 2+ and equal bilaterally.  Respiratory:  Respirations regular and unlabored, clear to auscultation bilaterally. GI: Soft, nontender, nondistended. MS: No deformity or atrophy. Skin: Warm and dry, no rash. Neuro: Strength and sensation are intact. Psych: Normal affect.  Accessory Clinical Findings    Recent Labs: No results found for requested labs within last 365 days.   Recent Lipid Panel    Component Value Date/Time   CHOL 146 06/08/2020 0826   TRIG 127 06/08/2020 0826   HDL 63 06/08/2020 0826   CHOLHDL 2.3 06/08/2020 0826   LDLCALC 61 06/08/2020 0826     ECG personally reviewed by me today: Normal sinus rhythm, rate 65 bpm.  No significant changes from 03/09/2020.  Assessment & Plan   Palpitations.  Long history of palpitations since childhood. Seen by Dr. Caryl Comes in 2010.  Unable to manage atrial tachycardia/PAF on flecainide, propafenone, or Multaq.  Patient reports daily episodes of palpitations lasting varying lengths of time.  EKG showed normal sinus rhythm, rate 65 bpm.  Discussed placing a ZIO monitor for evaluation of her palpitations.  She is not interested, stating she had one years ago when she was followed by Dr. Caryl Comes and it did not show anything.  Explained that  things could be very different now than they were 14+ years ago and wearing the monitor would be able to show what rhythm she is in when she is having palpitations. Described the mechanics of afib and explained if she is going in and out of A-fib it would be important to get started on anticoagulation for stroke prevention. Patient would prefer not to wear a monitor at this point. We discussed starting on low dose metoprolol to see if it can decrease her episodes of palpitations. She agrees to try it. Stressed to patient that even if her palpitations respond to the Metoprolol it will still not answer the question of what rhythm she is going in and out of and she would remain at risk for stroke if she is in fact going in and out of afib given CHA2DS2-VASc score 3. She expressed understanding.  Start metoprolol succinate 12.5 mg daily. Nonobstructive CAD.  Coronary CTA January 2022 with calcium score of 19 and mixed plaque in LAD. Patient denies chest pain, tightness, or pressure.  In the warmer months she stays active by walking.  Continue Repatha.  Start metoprolol (see #1). Hyperlipidemia.  LDL April 2022 61, at goal.  Will repeat lipid panel and LFTs today.  She is fasting.  Continue Repatha.  Disposition: Metoprolol succinate 12.5 mg daily.  Lipid panel and LFTs today.  Return in 6 months or sooner as needed.   Justice Britain. Ayham Word, DNP, NP-C     04/12/2022, 10:30 AM [place] [address] Office [phone] Fax [phone]

## 2022-04-12 ENCOUNTER — Encounter: Payer: Self-pay | Admitting: Student

## 2022-04-12 ENCOUNTER — Ambulatory Visit: Payer: Medicare Other | Attending: Student | Admitting: Student

## 2022-04-12 VITALS — BP 116/84 | HR 65 | Ht 69.0 in | Wt 167.2 lb

## 2022-04-12 DIAGNOSIS — E782 Mixed hyperlipidemia: Secondary | ICD-10-CM | POA: Diagnosis not present

## 2022-04-12 DIAGNOSIS — I2510 Atherosclerotic heart disease of native coronary artery without angina pectoris: Secondary | ICD-10-CM

## 2022-04-12 DIAGNOSIS — R002 Palpitations: Secondary | ICD-10-CM | POA: Diagnosis not present

## 2022-04-12 LAB — HEPATIC FUNCTION PANEL
ALT: 36 IU/L — ABNORMAL HIGH (ref 0–32)
AST: 35 IU/L (ref 0–40)
Albumin: 4.3 g/dL (ref 3.9–4.9)
Alkaline Phosphatase: 99 IU/L (ref 44–121)
Bilirubin Total: 0.5 mg/dL (ref 0.0–1.2)
Bilirubin, Direct: 0.18 mg/dL (ref 0.00–0.40)
Total Protein: 6.4 g/dL (ref 6.0–8.5)

## 2022-04-12 LAB — LIPID PANEL
Chol/HDL Ratio: 2.1 ratio (ref 0.0–4.4)
Cholesterol, Total: 108 mg/dL (ref 100–199)
HDL: 51 mg/dL (ref >39)
LDL Chol Calc (NIH): 45 mg/dL (ref 0–99)
Triglycerides: 51 mg/dL (ref 0–149)
VLDL Cholesterol Cal: 12 mg/dL (ref 5–40)

## 2022-04-12 MED ORDER — METOPROLOL SUCCINATE ER 25 MG PO TB24: 12.5000 mg | ORAL_TABLET | Freq: Every day | ORAL | 3 refills | Status: DC

## 2022-04-12 NOTE — Patient Instructions (Signed)
Medication Instructions:  Your physician has recommended you make the following change in your medication:  START: Metoprolol Succinate 12.'5mg'$  daily  *If you need a refill on your cardiac medications before your next appointment, please call your pharmacy*   Lab Work: Your physician recommends that you have the following labs drawn today: Lipid panel and Lft's  If you have labs (blood work) drawn today and your tests are completely normal, you will receive your results only by: [place] (if you have MyChart) OR A paper copy in the mail If you have any lab test that is abnormal or we need to change your treatment, we will call you to review the results.   Testing/Procedures: NONE   Follow-Up: At [facility], you and your health needs are our priority.  As part of our continuing mission to provide you with exceptional heart care, we have created designated Provider Care Teams.  These Care Teams include your primary Cardiologist (physician) and Advanced Practice Providers (APPs -  Physician Assistants and Nurse Practitioners) who all work together to provide you with the care you need, when you need it.  We recommend signing up for the patient portal called "MyChart".  Sign up information is provided on this After Visit Summary.  MyChart is used to connect with patients for Virtual Visits (Telemedicine).  Patients are able to view lab/test results, encounter notes, upcoming appointments, etc.  Non-urgent messages can be sent to your provider as well.   To learn more about what you can do with MyChart, go to [email or web address].    Your next appointment:   6 month(s)  Provider:   Peter Martinique, MD

## 2022-04-18 NOTE — Progress Notes (Signed)
Please let patient know that things that elevate liver enzymes include alcohol and Tylenol. I spoke with Pharm D and though it is unlikely, Repatha does work in the liver so may cause a slight increase. We will continue to monitor this. Typically there is concern if enzyme is twice the normal limit.

## 2022-05-10 DIAGNOSIS — C44519 Basal cell carcinoma of skin of other part of trunk: Secondary | ICD-10-CM | POA: Diagnosis not present

## 2022-05-10 DIAGNOSIS — Z85828 Personal history of other malignant neoplasm of skin: Secondary | ICD-10-CM | POA: Diagnosis not present

## 2022-05-10 DIAGNOSIS — L812 Freckles: Secondary | ICD-10-CM | POA: Diagnosis not present

## 2022-05-10 DIAGNOSIS — D225 Melanocytic nevi of trunk: Secondary | ICD-10-CM | POA: Diagnosis not present

## 2022-05-10 DIAGNOSIS — L821 Other seborrheic keratosis: Secondary | ICD-10-CM | POA: Diagnosis not present

## 2022-05-10 DIAGNOSIS — D2371 Other benign neoplasm of skin of right lower limb, including hip: Secondary | ICD-10-CM | POA: Diagnosis not present

## 2022-05-22 ENCOUNTER — Other Ambulatory Visit: Payer: Self-pay | Admitting: Cardiology

## 2022-05-22 DIAGNOSIS — E7800 Pure hypercholesterolemia, unspecified: Secondary | ICD-10-CM

## 2022-05-23 MED ORDER — REPATHA SURECLICK 140 MG/ML ~~LOC~~ SOAJ: 140.0000 mg | SUBCUTANEOUS | 3 refills | Status: DC

## 2022-05-23 NOTE — Addendum Note (Signed)
Addended by: Anda Latina on: 05/23/2022 01:01 PM   Modules accepted: Orders

## 2022-05-25 ENCOUNTER — Encounter: Payer: Self-pay | Admitting: Pharmacist

## 2022-05-25 NOTE — Telephone Encounter (Signed)
This encounter was created in error - please disregard.

## 2022-09-20 DIAGNOSIS — Z1231 Encounter for screening mammogram for malignant neoplasm of breast: Secondary | ICD-10-CM | POA: Diagnosis not present

## 2022-10-30 ENCOUNTER — Ambulatory Visit: Payer: Medicare Other | Admitting: Cardiology

## 2022-11-22 NOTE — Progress Notes (Deleted)
Cardiology Office Note   Date:  11/22/2022   ID:  Beth Schmidt, DOB 02-21-1956, MRN [id number]  PCP:  Beth James, MD  Cardiologist:   Beth Rothman [place], MD   No chief complaint on file.     History of Present Illness: Beth Schmidt is a 67 y.o. female who is seen at the request of Dr Beth Schmidt for evaluation of CV risk. She was seen remotely by Dr Beth Schmidt and  Dr Beth Schmidt and had a normal cardiac cath in 2007. She has a history of atrial tachycardia and paroxysmal Afib. Previously seen by Dr Beth Schmidt. Last seen in 2010. Refractory to multiple drugs including Flecainide, propafenone, and Multaq. There was consideration for Tikosyn but this was never done. Since then she has been off all medication.   She was seen in 2022 for evaluation of chest pain. Troponin levels were normal. Ecg showed no acute change. She underwent coronary CTA showing nonobstructive CAD.  She reports intolerance to statins due to myalgias. She was last seen in our office in January with some palpitations. Opted not to wear a monitor. Placed on low dose metoprolol.     Past Medical History:  Diagnosis Date   Anxiety    Atrial fibrillation    Hyperlipidemia    Palpitations    Rheumatic fever    SVT (supraventricular tachycardia)     Past Surgical History:  Procedure Laterality Date   arthroscopic knee     CARDIAC CATHETERIZATION  06/01/2005   EF 60%   CATARACT EXTRACTION, BILATERAL Bilateral 05/2018   US ECHOCARDIOGRAPHY  05/14/2008   EF 55-60%     Current Outpatient Medications  Medication Sig Dispense Refill   Evolocumab (REPATHA SURECLICK) 140 MG/ML SOAJ Inject 140 mg into the skin every 14 (fourteen) days. 6 mL 3   gabapentin (NEURONTIN) 300 MG capsule Take 1 capsule (300 mg total) by mouth at bedtime. 30 capsule 0   metoprolol succinate (TOPROL-XL) 25 MG 24 hr tablet Take 0.5 tablets (12.5 mg total) by mouth daily. 45 tablet 3   naproxen (NAPROSYN) 500 MG tablet TAKE 1 TABLET BY MOUTH TWICE DAILY  WITH A MEAL AS NEEDED FOR PAIN 90 tablet 2   No current facility-administered medications for this visit.    Allergies:   Crestor [rosuvastatin] and Lipitor [atorvastatin]    Social History:  The patient  reports that she has never smoked. She has never used smokeless tobacco. She reports that she does not drink alcohol and does not use drugs.   Family History:  The patient's family history includes Cardiomyopathy in her mother; Coronary artery disease in her father; Stroke in her mother.    ROS:  Please see the history of present illness.   Otherwise, review of systems are positive for .   All other systems are reviewed and negative.    PHYSICAL EXAM: VS:  There were no vitals taken for this visit. , BMI There is no height or weight on file to calculate BMI. GEN: Well nourished, well developed, in no acute distress  HEENT: normal  Neck: no JVD, carotid bruits, or masses Cardiac: RRR; no murmurs, rubs, or gallops,no edema  Respiratory:  clear to auscultation bilaterally, normal work of breathing GI: soft, nontender, nondistended, + BS MS: no deformity or atrophy  Skin: warm and dry, no rash Neuro:  Strength and sensation are intact Psych: euthymic mood, full affect   EKG:  EKG is ordered today. The ekg ordered today demonstrates NSR rate 79. Nonspecific  ST-T abnormality. I have personally reviewed and interpreted this study.    Recent Labs: 04/12/2022: ALT 36    Lipid Panel    Component Value Date/Time   CHOL 108 04/12/2022 1111   TRIG 51 04/12/2022 1111   HDL 51 04/12/2022 1111   CHOLHDL 2.1 04/12/2022 1111   LDLCALC 45 04/12/2022 1111     Labs dated 05/12/19: cholesterol 212, triglycerides 138, HDL 53, LDL 146. LFTs and TSH normal  Wt Readings from Last 3 Encounters:  04/12/22 167 lb 3.2 oz (75.8 kg)  04/19/20 178 lb (80.7 kg)  03/09/20 180 lb (81.6 kg)      Other studies Reviewed: Additional studies/ records that were reviewed today include:    Cardiac/Coronary CTA   TECHNIQUE: The patient was scanned on a [employer].   FINDINGS: A 100 kV prospective scan was triggered in the descending thoracic aorta at 111 HU's. Axial non-contrast 3 mm slices were carried out through the heart. The data set was analyzed on a dedicated work station and scored using the Agatson method. Gantry rotation speed was 250 msecs and collimation was .6 mm. No beta blockade and 0.8 mg of sl NTG was given. The 3D data set was reconstructed in 5% intervals of the 35-75 % of the R-R cycle. Phases were analyzed on a dedicated work station using MPR, MIP and VRT modes. The patient received 80 cc of contrast.   Coronary Arteries:  Normal coronary origin.  Right dominance.   RCA is a large dominant artery.  There is no plaque.   Left main is a large artery that gives rise to LAD and LCX arteries.   LAD is a large vessel. There is mixed plaque in the proximal LAD causing mild (25-49%) stenosis. There is calcified plaque in the mid LAD causes minimal (0-24%) stenosis. Noncalcified plaque in the mid LAD causes minimal (0-24%) stenosis.   LCX is a non-dominant artery that gives rise to one large OM1 branch. There is no plaque.   Other findings:   Left Ventricle: Normal size   Left Atrium: Mild enlargement   Pulmonary Veins: Normal configuration   Right Ventricle: Normal size   Right Atrium: Mild enlargement   Cardiac valves: No calcifications   Thoracic aorta: Normal size   Pulmonary Arteries: Normal size   Systemic Veins: Normal drainage   Pericardium: Normal thickness   IMPRESSION: 1. Coronary calcium score of 19. This was 62nd percentile for age and sex matched control.   2. Nonobstructive CAD   3. Mixed plaque in the proximal LAD causes mild (25-49%) stenosis, calcified plaque in the mid LAD causes minimal (0-24%) stenosis, and noncalcified plaque in the mid LAD causes minimal (0-24%) stenosis.   CAD-RADS 2. Mild  non-obstructive CAD (25-49%). Consider non-atherosclerotic causes of chest pain. Consider preventive therapy and risk factor modification.     Electronically Signed   By: Epifanio Lesches MD   On: 03/30/2020 22:3   ASSESSMENT AND PLAN:  1. Nonobstructive CAD.  2. HLD. Await results of CT. She is intolerant of statins 3. History of atrial tachycardia. She is doing well on no therapy.   Current medicines are reviewed at length with the patient today.  The patient does not have concerns regarding medicines.  The following changes have been made:  no change  Labs/ tests ordered today include:   No orders of the defined types were placed in this encounter.    Disposition:   FU TBD based on CT results.  Signed, Mando Blatz [place], MD  11/22/2022 8:44 AM    [facility] [address] [place], [place], [address] Phone [phone], Fax [phone]

## 2022-11-30 ENCOUNTER — Ambulatory Visit: Payer: Medicare Other | Admitting: Cardiology

## 2023-01-07 NOTE — Progress Notes (Unsigned)
Cardiology Office Note   Date:  01/10/2023   ID:  Beth Schmidt, DOB 1955/07/03, MRN [id number]  PCP:  Deatra James, MD  Cardiologist:   Merly Hinkson [place], MD   Chief Complaint  Patient presents with   Hyperlipidemia      History of Present Illness: Beth Schmidt is a 67 y.o. female who is seen for follow up CAD. She  had a normal cardiac cath in 2007. She has a history of atrial tachycardia and paroxysmal Afib. Previously seen by Dr Graciela Husbands. Last seen in 2010. Refractory to multiple drugs including Flecainide, propafenone, and Multaq. There was consideration for Tikosyn but this was never done. Since then she has been off all medication.   She was seen in 2022 for evaluation of chest pain. Troponin levels were normal. Ecg showed no acute change. Coronary CTA showed mild nonobstructive CAD.  She reports intolerance to statins due to myalgias. She was placed on Repatha with excellent results.   She is doing well on follow up today. No chest pain or dyspnea. Very rare self limited palpitations. Would like to lose weight but doesn't qualify for GLP 1 agonist.     Past Medical History:  Diagnosis Date   Anxiety    Atrial fibrillation    Hyperlipidemia    Palpitations    Rheumatic fever    SVT (supraventricular tachycardia) (HCC)     Past Surgical History:  Procedure Laterality Date   arthroscopic knee     CARDIAC CATHETERIZATION  06/01/2005   EF 60%   CATARACT EXTRACTION, BILATERAL Bilateral 05/2018   US ECHOCARDIOGRAPHY  05/14/2008   EF 55-60%     Current Outpatient Medications  Medication Sig Dispense Refill   Evolocumab (REPATHA SURECLICK) 140 MG/ML SOAJ Inject 140 mg into the skin every 14 (fourteen) days. 6 mL 3   naproxen (NAPROSYN) 500 MG tablet TAKE 1 TABLET BY MOUTH TWICE DAILY WITH A MEAL AS NEEDED FOR PAIN 90 tablet 2   No current facility-administered medications for this visit.    Allergies:   Crestor [rosuvastatin] and Lipitor [atorvastatin]    Social  History:  The patient  reports that she has never smoked. She has never used smokeless tobacco. She reports that she does not drink alcohol and does not use drugs.   Family History:  The patient's family history includes Cardiomyopathy in her mother; Coronary artery disease in her father; Stroke in her mother.    ROS:  Please see the history of present illness.   Otherwise, review of systems are positive for .   All other systems are reviewed and negative.    PHYSICAL EXAM: VS:  BP 133/74 (BP Location: Left Arm, Patient Position: Sitting, Cuff Size: Normal)   Pulse 69   Ht 5\' 9"  (1.753 m)   Wt 177 lb (80.3 kg)   SpO2 97%   BMI 26.14 kg/m  , BMI Body mass index is 26.14 kg/m. GEN: Well nourished, well developed, in no acute distress  HEENT: normal  Neck: no JVD, carotid bruits, or masses Cardiac: RRR; no murmurs, rubs, or gallops,no edema  Respiratory:  clear to auscultation bilaterally, normal work of breathing GI: soft, nontender, nondistended, + BS MS: no deformity or atrophy  Skin: warm and dry, no rash Neuro:  Strength and sensation are intact Psych: euthymic mood, full affect   EKG:  EKG is not ordered today.     Recent Labs: 04/12/2022: ALT 36    Lipid Panel    Component  Value Date/Time   CHOL 108 04/12/2022 1111   TRIG 51 04/12/2022 1111   HDL 51 04/12/2022 1111   CHOLHDL 2.1 04/12/2022 1111   LDLCALC 45 04/12/2022 1111     Labs dated 05/12/19: cholesterol 212, triglycerides 138, HDL 53, LDL 146. LFTs and TSH normal  Wt Readings from Last 3 Encounters:  01/10/23 177 lb (80.3 kg)  04/12/22 167 lb 3.2 oz (75.8 kg)  04/19/20 178 lb (80.7 kg)      Other studies Reviewed: Additional studies/ records that were reviewed today include:   Coronary CTA 03/30/20: Cardiac/Coronary CTA   TECHNIQUE: The patient was scanned on a [employer].   FINDINGS: A 100 kV prospective scan was triggered in the descending thoracic aorta at 111 HU's. Axial  non-contrast 3 mm slices were carried out through the heart. The data set was analyzed on a dedicated work station and scored using the Agatson method. Gantry rotation speed was 250 msecs and collimation was .6 mm. No beta blockade and 0.8 mg of sl NTG was given. The 3D data set was reconstructed in 5% intervals of the 35-75 % of the R-R cycle. Phases were analyzed on a dedicated work station using MPR, MIP and VRT modes. The patient received 80 cc of contrast.   Coronary Arteries:  Normal coronary origin.  Right dominance.   RCA is a large dominant artery.  There is no plaque.   Left main is a large artery that gives rise to LAD and LCX arteries.   LAD is a large vessel. There is mixed plaque in the proximal LAD causing mild (25-49%) stenosis. There is calcified plaque in the mid LAD causes minimal (0-24%) stenosis. Noncalcified plaque in the mid LAD causes minimal (0-24%) stenosis.   LCX is a non-dominant artery that gives rise to one large OM1 branch. There is no plaque.   Other findings:   Left Ventricle: Normal size   Left Atrium: Mild enlargement   Pulmonary Veins: Normal configuration   Right Ventricle: Normal size   Right Atrium: Mild enlargement   Cardiac valves: No calcifications   Thoracic aorta: Normal size   Pulmonary Arteries: Normal size   Systemic Veins: Normal drainage   Pericardium: Normal thickness   IMPRESSION: 1. Coronary calcium score of 19. This was 62nd percentile for age and sex matched control.   2. Nonobstructive CAD   3. Mixed plaque in the proximal LAD causes mild (25-49%) stenosis, calcified plaque in the mid LAD causes minimal (0-24%) stenosis, and noncalcified plaque in the mid LAD causes minimal (0-24%) stenosis.   CAD-RADS 2. Mild non-obstructive CAD (25-49%). Consider non-atherosclerotic causes of chest pain. Consider preventive therapy and risk factor modification.     Electronically Signed   By: Epifanio Lesches  MD   On: 03/30/2020 22:37   ASSESSMENT AND PLAN:  1.  Nonobstructive CAD.  2. HLD.  She is intolerant of statins. Doing well on Repatha.  3. History of atrial tachycardia. She is doing well on no therapy.   Current medicines are reviewed at length with the patient today.  The patient does not have concerns regarding medicines.  The following changes have been made:  no change  Labs/ tests ordered today include:   No orders of the defined types were placed in this encounter.    Disposition:   follow up one year   Signed, Charliene Inoue [place], MD  01/10/2023 9:13 AM    [facility] [address] [place], [place], [address]  Phone [phone], Fax [phone]

## 2023-01-10 ENCOUNTER — Telehealth: Payer: Self-pay | Admitting: Pharmacist

## 2023-01-10 ENCOUNTER — Other Ambulatory Visit (HOSPITAL_COMMUNITY): Payer: Self-pay

## 2023-01-10 ENCOUNTER — Encounter: Payer: Self-pay | Admitting: Cardiology

## 2023-01-10 ENCOUNTER — Ambulatory Visit: Payer: Medicare Other | Attending: Cardiology | Admitting: Cardiology

## 2023-01-10 VITALS — BP 133/74 | HR 69 | Ht 69.0 in | Wt 177.0 lb

## 2023-01-10 DIAGNOSIS — E782 Mixed hyperlipidemia: Secondary | ICD-10-CM

## 2023-01-10 DIAGNOSIS — I2510 Atherosclerotic heart disease of native coronary artery without angina pectoris: Secondary | ICD-10-CM

## 2023-01-10 MED ORDER — REPATHA SURECLICK 140 MG/ML ~~LOC~~ SOAJ
140.0000 mg | SUBCUTANEOUS | 3 refills | Status: DC
  Filled 2023-01-10: qty 6, 84d supply, fill #0

## 2023-01-10 NOTE — Telephone Encounter (Signed)
-----   Message from Nurse Fernand Parkins sent at 01/10/2023  9:36 AM EST ----- Thayer Ohm this patient needs Repatha refilled    Thanks

## 2023-01-10 NOTE — Patient Instructions (Signed)
Medication Instructions:  Continue same medications *If you need a refill on your cardiac medications before your next appointment, please call your pharmacy*   Lab Work: None ordered   Testing/Procedures: None ordered   Follow-Up: At [facility], you and your health needs are our priority.  As part of our continuing mission to provide you with exceptional heart care, we have created designated Provider Care Teams.  These Care Teams include your primary Cardiologist (physician) and Advanced Practice Providers (APPs -  Physician Assistants and Nurse Practitioners) who all work together to provide you with the care you need, when you need it.  We recommend signing up for the patient portal called "MyChart".  Sign up information is provided on this After Visit Summary.  MyChart is used to connect with patients for Virtual Visits (Telemedicine).  Patients are able to view lab/test results, encounter notes, upcoming appointments, etc.  Non-urgent messages can be sent to your provider as well.   To learn more about what you can do with MyChart, go to [email or web address].    Your next appointment:  1 year  Call in July to schedule Nov appointment    Provider:  Dr.Jordan

## 2023-01-11 NOTE — Telephone Encounter (Signed)
Called patient left message on personal voice mail Repatha refilled.

## 2023-01-21 ENCOUNTER — Other Ambulatory Visit (HOSPITAL_COMMUNITY): Payer: Self-pay

## 2023-01-24 ENCOUNTER — Telehealth: Payer: Self-pay | Admitting: Orthopaedic Surgery

## 2023-01-24 ENCOUNTER — Other Ambulatory Visit: Payer: Self-pay | Admitting: Orthopaedic Surgery

## 2023-01-24 DIAGNOSIS — M1711 Unilateral primary osteoarthritis, right knee: Secondary | ICD-10-CM

## 2023-01-24 NOTE — Telephone Encounter (Signed)
Patient called. She would like a refill on naproxen. Her cb# [phone]

## 2023-01-24 NOTE — Telephone Encounter (Signed)
LMOM for patient of the below message from [place]  

## 2023-01-30 ENCOUNTER — Other Ambulatory Visit: Payer: Self-pay | Admitting: Pharmacist

## 2023-01-30 DIAGNOSIS — I2510 Atherosclerotic heart disease of native coronary artery without angina pectoris: Secondary | ICD-10-CM

## 2023-01-30 DIAGNOSIS — E782 Mixed hyperlipidemia: Secondary | ICD-10-CM

## 2023-01-30 MED ORDER — REPATHA SURECLICK 140 MG/ML ~~LOC~~ SOAJ: 140.0000 mg | SUBCUTANEOUS | 3 refills | Status: DC

## 2023-03-26 ENCOUNTER — Telehealth: Payer: Self-pay | Admitting: Pharmacist

## 2023-03-26 NOTE — Telephone Encounter (Signed)
Patient walked in asking to be enrolled in [employer]. Printed and given to patient. New grant info:  Card No. [id number] Card Status Active BIN [id number] PCN [id number] [facility] [id number]

## 2023-05-14 DIAGNOSIS — J029 Acute pharyngitis, unspecified: Secondary | ICD-10-CM | POA: Diagnosis not present

## 2023-05-14 DIAGNOSIS — S161XXA Strain of muscle, fascia and tendon at neck level, initial encounter: Secondary | ICD-10-CM | POA: Diagnosis not present

## 2023-07-08 ENCOUNTER — Other Ambulatory Visit: Payer: Self-pay | Admitting: Radiology

## 2023-07-08 DIAGNOSIS — M1711 Unilateral primary osteoarthritis, right knee: Secondary | ICD-10-CM

## 2023-07-08 MED ORDER — NAPROXEN 500 MG PO TABS: ORAL_TABLET | ORAL | 2 refills | Status: AC

## 2023-07-08 NOTE — Telephone Encounter (Signed)
 Requests refill meds.

## 2023-11-01 ENCOUNTER — Emergency Department (HOSPITAL_COMMUNITY)
Admission: EM | Admit: 2023-11-01 | Discharge: 2023-11-01 | Disposition: A | Discharge status: Home / Self Care | Attending: Emergency Medicine | Admitting: Emergency Medicine

## 2023-11-01 ENCOUNTER — Other Ambulatory Visit: Payer: Self-pay

## 2023-11-01 ENCOUNTER — Encounter (HOSPITAL_COMMUNITY): Payer: Self-pay

## 2023-11-01 DIAGNOSIS — R0602 Shortness of breath: Secondary | ICD-10-CM | POA: Diagnosis not present

## 2023-11-01 DIAGNOSIS — M2550 Pain in unspecified joint: Secondary | ICD-10-CM | POA: Diagnosis not present

## 2023-11-01 DIAGNOSIS — T7840XA Allergy, unspecified, initial encounter: Secondary | ICD-10-CM | POA: Diagnosis present

## 2023-11-01 DIAGNOSIS — R609 Edema, unspecified: Secondary | ICD-10-CM

## 2023-11-01 DIAGNOSIS — R600 Localized edema: Secondary | ICD-10-CM | POA: Diagnosis not present

## 2023-11-01 LAB — CBC WITH DIFFERENTIAL/PLATELET
Abs Immature Granulocytes: 0.06 K/uL (ref 0.00–0.07)
Basophils Absolute: 0.1 K/uL (ref 0.0–0.1)
Basophils Relative: 1 %
Eosinophils Absolute: 0.1 K/uL (ref 0.0–0.5)
Eosinophils Relative: 3 %
HCT: 35.9 % — ABNORMAL LOW (ref 36.0–46.0)
Hemoglobin: 12 g/dL (ref 12.0–15.0)
Immature Granulocytes: 2 %
Lymphocytes Relative: 31 %
Lymphs Abs: 1.3 K/uL (ref 0.7–4.0)
MCH: 29.9 pg (ref 26.0–34.0)
MCHC: 33.4 g/dL (ref 30.0–36.0)
MCV: 89.5 fL (ref 80.0–100.0)
Monocytes Absolute: 0.3 K/uL (ref 0.1–1.0)
Monocytes Relative: 8 %
Neutro Abs: 2.3 K/uL (ref 1.7–7.7)
Neutrophils Relative %: 55 %
Platelets: 271 K/uL (ref 150–400)
RBC: 4.01 MIL/uL (ref 3.87–5.11)
RDW: 12.5 % (ref 11.5–15.5)
WBC: 4.1 K/uL (ref 4.0–10.5)
nRBC: 0 % (ref 0.0–0.2)

## 2023-11-01 LAB — COMPREHENSIVE METABOLIC PANEL WITH GFR
ALT: 19 U/L (ref 0–44)
AST: 22 U/L (ref 15–41)
Albumin: 3.7 g/dL (ref 3.5–5.0)
Alkaline Phosphatase: 75 U/L (ref 38–126)
Anion gap: 11 (ref 5–15)
BUN: 13 mg/dL (ref 8–23)
CO2: 22 mmol/L (ref 22–32)
Calcium: 8.7 mg/dL — ABNORMAL LOW (ref 8.9–10.3)
Chloride: 111 mmol/L (ref 98–111)
Creatinine, Ser: 0.94 mg/dL (ref 0.44–1.00)
GFR, Estimated: >60 mL/min (ref >60)
Glucose, Bld: 87 mg/dL (ref 70–99)
Potassium: 4.1 mmol/L (ref 3.5–5.1)
Sodium: 144 mmol/L (ref 135–145)
Total Bilirubin: 0.8 mg/dL (ref 0.0–1.2)
Total Protein: 6.5 g/dL (ref 6.5–8.1)

## 2023-11-01 LAB — BRAIN NATRIURETIC PEPTIDE: B Natriuretic Peptide: 134.7 pg/mL — ABNORMAL HIGH (ref 0.0–100.0)

## 2023-11-01 MED ORDER — DIPHENHYDRAMINE HCL 50 MG/ML IJ SOLN
25.0000 mg | Freq: Once | INTRAMUSCULAR | Status: AC
  Administered 2023-11-01: 25 mg via INTRAMUSCULAR
  Filled 2023-11-01: qty 1

## 2023-11-01 NOTE — ED Triage Notes (Signed)
 Pt came in via POV d/t allergic reaction since Tuesday, unknown what's causing it. Reports hands, feet/ankles are swollen & her joints are sore plus feeling sore behind her ears. Denies any breathing difficulties, no throat/oral swelling. Did see her PCP today & was sent here.

## 2023-11-01 NOTE — ED Provider Notes (Signed)
 [place] EMERGENCY DEPARTMENT AT [facility] Provider Note   CSN: [id number] Arrival date & time: 11/01/23  1520     Patient presents with: Allergic Reaction   Beth Schmidt is a 68 y.o. female.   68 yo F with a chief complaints of a sensation like her feet and ankles send and hands are swollen.  Has been going on for about 4 to 5 days.  Feeling achy all over as well.  She went to urgent care.  They told her to come to the ED to get lab work performed.  She denies cough congestion or fever.  Denies nausea vomiting or diarrhea.  She think she has gained about 5 pounds in the past week.   Allergic Reaction      Prior to Admission medications   Medication Sig Start Date End Date Taking? Authorizing Provider  Evolocumab  (REPATHA  SURECLICK) 140 MG/ML SOAJ Inject 140 mg into the skin every 14 (fourteen) days. 01/30/23   [place], Peter M, MD  naproxen  (NAPROSYN ) 500 MG tablet TAKE 1 TABLET BY MOUTH TWICE DAILY WITH A MEAL AS NEEDED FOR PAIN 07/08/23   [name], MD    Allergies: Crestor [rosuvastatin] and Lipitor [atorvastatin]    Review of Systems  Updated Vital Signs BP (!) 146/76 (BP Location: Right Arm)   Pulse 82   Temp 98.7 F (37.1 C)   Resp 17   SpO2 100%   Physical Exam Vitals and nursing note reviewed.  Constitutional:      General: She is not in acute distress.    Appearance: She is well-developed. She is not diaphoretic.  HENT:     Head: Normocephalic and atraumatic.  Eyes:     Pupils: Pupils are equal, round, and reactive to light.  Cardiovascular:     Rate and Rhythm: Normal rate and regular rhythm.     Heart sounds: No murmur heard.    No friction rub. No gallop.  Pulmonary:     Effort: Pulmonary effort is normal.     Breath sounds: No wheezing or rales.  Abdominal:     General: There is no distension.     Palpations: Abdomen is soft.     Tenderness: There is no abdominal tenderness.  Musculoskeletal:        General: No  tenderness.     Cervical back: Normal range of motion and neck supple.     Comments: Diffuse erythema.  Skin:    General: Skin is warm and dry.  Neurological:     Mental Status: She is alert and oriented to person, place, and time.  Psychiatric:        Behavior: Behavior normal.     (all labs ordered are listed, but only abnormal results are displayed) Labs Reviewed  COMPREHENSIVE METABOLIC PANEL WITH GFR - Abnormal; Notable for the following components:      Result Value   Calcium 8.7 (*)    All other components within normal limits  CBC WITH DIFFERENTIAL/PLATELET - Abnormal; Notable for the following components:   HCT 35.9 (*)    All other components within normal limits  BRAIN NATRIURETIC PEPTIDE - Abnormal; Notable for the following components:   B Natriuretic Peptide 134.7 (*)    All other components within normal limits    EKG: None  Radiology: No results found.   Procedures   Medications Ordered in the ED  diphenhydrAMINE  (BENADRYL ) injection 25 mg (25 mg Intramuscular Given 11/01/23 1606)  Medical Decision Making Amount and/or Complexity of Data Reviewed Labs: ordered.   68 yo F with a cc of arthralgias and leg and hand swelling and itching.  This been going on for about a week.  No obvious edema on exam.  She does have diffuse erythema I think most likely secondary to sun exposure.  Will obtain blood work.   No anemia, no leukocytosis.  Renal function at baseline.  No LFT elevation.  BNP is in an indeterminate range.  Less than 200 with no rales or JVD.  I think unlikely to be acute heart failure.  Will have the patient follow-up with her PCP in clinic.  7:13 PM:  I have discussed the diagnosis/risks/treatment options with the patient and family.  Evaluation and diagnostic testing in the emergency department does not suggest an emergent condition requiring admission or immediate intervention beyond what has been  performed at this time.  They will follow up with PCP. We also discussed returning to the ED immediately if new or worsening sx occur. We discussed the sx which are most concerning (e.g., sudden worsening pain, fever, inability to tolerate by mouth, sob) that necessitate immediate return. Medications administered to the patient during their visit and any new prescriptions provided to the patient are listed below.  Medications given during this visit Medications  diphenhydrAMINE  (BENADRYL ) injection 25 mg (25 mg Intramuscular Given 11/01/23 1606)     The patient appears reasonably screen and/or stabilized for discharge and I doubt any other medical condition or other [facility] requiring further screening, evaluation, or treatment in the ED at this time prior to discharge.      Final diagnoses:  Edema, unspecified type    ED Discharge Orders     None          Emil Share, DO 11/01/23 1913

## 2023-11-01 NOTE — ED Provider Triage Note (Signed)
 Emergency Medicine Provider Triage Evaluation Note  Beth Schmidt , a 68 y.o. female  was evaluated in triage.  Pt complains of hand/feet joint pain and generalized itching developing over the past couple of days. Patient has not taken any medication for these symptoms. Patient denies fever, chest pain, SOB, nausea, vomiting, diarrhea. No signs of anaphylaxis.   Review of Systems  Positive:  Negative:   Physical Exam  BP (!) 146/76 (BP Location: Right Arm)   Pulse 82   Temp 98.7 F (37.1 C)   Resp 17   SpO2 100%  Gen:   Awake, no distress   Resp:  Normal effort  MSK:   Moves extremities without difficulty  Other:    Medical Decision Making  Medically screening exam initiated at 3:42 PM.  Appropriate orders placed.  Beth Schmidt was informed that the remainder of the evaluation will be completed by another provider, this initial triage assessment does not replace that evaluation, and the importance of remaining in the ED until their evaluation is complete.     Hoy Nidia FALCON, [place] 11/01/23 [phone]

## 2023-11-01 NOTE — Discharge Instructions (Signed)
 Your lab work looks good.  Please follow-up with your family doctor in the office.  They may elect to do more tests or an ultrasound of your heart to or have you referred to a cardiologist.  Please return for difficulty breathing, worsening swelling.

## 2023-11-01 NOTE — ED Notes (Signed)
 PT D/C'D AFTER INSTRUCTIONS REVIEWED. PT VERBALIZED UNDERSTANDING. NAD REPORTED OR NOTED AT THIS TIME.

## 2023-11-08 ENCOUNTER — Telehealth: Payer: Self-pay | Admitting: Cardiology

## 2023-11-08 NOTE — Telephone Encounter (Signed)
 Returned patient's call regarding c/o chest pain and SOB.  Patient reports that about 10 days ago she developed swelling of her arms, hands and feet. On 11/01/23 she went to [facility] ED and was evaluated.   Per ED note, Patient had no obvious edema on exam.  She does have diffuse erythema I think most likely secondary to sun exposure.  No anemia, no leukocytosis.  Renal function at baseline.  No LFT elevation. BNP is in an indeterminate range.  Less than 200 with no rales or JVD.  I think unlikely to be acute heart failure.  Will have the patient follow-up with her PCP in clinic.  Patient reports that she did not follow up with her PCP, but now reports worsening intermittent SOB and chest pain over the last few months. She does not weigh herself or track her HR or BP at home. She is not sure if SOB or Chest pain is associated with rest or exertion. 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     Per ED MD 11/01/23  We also discussed returning to the ED immediately if new or worsening sx occur. We discussed the sx which are most concerning (e.g., sudden worsening pain, fever, inability to tolerate by mouth, sob) that necessitate immediate return. Because patient is reporting worsening SOB/Chest pain I advised her to return to ED for evaluation. Patient verbalizes understanding and agrees to plan.

## 2023-11-08 NOTE — Telephone Encounter (Signed)
   Pt c/o of Chest Pain: STAT if active CP, including tightness, pressure, jaw pain, radiating pain to shoulder/upper arm/back, CP unrelieved by Nitro. Symptoms reported of SOB, nausea, vomiting, sweating.  1. Are you having CP right now? No    2. Are you experiencing any other symptoms (ex. SOB, nausea, vomiting, sweating)? SOB at times   3. Is your CP continuous or coming and going? Come and go   4. Have you taken Nitroglycerin ? No   5. How long have you been experiencing CP? Few weeks    6. If NO CP at time of call then end call with telling Pt to call back or call 911 if Chest pain returns prior to return call from triage team.  Pt c/o swelling/edema: STAT if pt has developed SOB within 24 hours  If swelling, where is the swelling located? Hands, feet and fingers since last Tuesday  How much weight have you gained and in what time span? NA  Have you gained 2 pounds in a day or 5 pounds in a week? NA  Do you have a log of your daily weights (if so, list)? No  Are you currently taking a fluid pill? No  Are you currently SOB? No  Have you traveled recently in a car or plane for an extended period of time? No

## 2023-11-11 ENCOUNTER — Telehealth: Payer: Self-pay | Admitting: Cardiology

## 2023-11-11 NOTE — Telephone Encounter (Signed)
 Was able to speak with the patient and she states that she is having bilateral swelling in her hands, arms, legs, and feet. She denies any shortness of breath, chest pain, dizziness or loss of consciousness. She said she has been feeling like this for a week now and had to get her rings cut off, as well as go to the ER. She would like a fluid pill to help and I will let Dr. Kennyth know of this and communicate with the patient with what he advises. ER precautions were also given.

## 2023-11-11 NOTE — Telephone Encounter (Signed)
 Pt c/o medication issue:  1. Name of Medication:   Fluid pill  2. How are you currently taking this medication (dosage and times per day)?   No currently taking  3. Are you having a reaction (difficulty breathing--STAT)?   4. What is your medication issue?   Patient wants to know if she can be prescribe a fluid pill to address her swelling.  See note dated 9/5.  Patient noted swelling in feet has been going down with elevation but her hans and arms are still swelling.d  Patient has appointment scheduled on 9/23 with K. [place].

## 2023-11-12 ENCOUNTER — Other Ambulatory Visit: Payer: Self-pay

## 2023-11-12 MED ORDER — FUROSEMIDE 20 MG PO TABS: 20.0000 mg | ORAL_TABLET | Freq: Every day | ORAL | 0 refills | Status: DC

## 2023-11-12 NOTE — Telephone Encounter (Signed)
 Spoke to patient Dr.Jordan prescribed Lasix  20 mg daily until appointment with Katlyn West NP. Appointment  rescheduled to 9/25 at 10:55 am.

## 2023-11-13 ENCOUNTER — Emergency Department (HOSPITAL_BASED_OUTPATIENT_CLINIC_OR_DEPARTMENT_OTHER)
Admission: EM | Admit: 2023-11-13 | Discharge: 2023-11-13 | Disposition: A | Discharge status: Home / Self Care | Attending: Emergency Medicine | Admitting: Emergency Medicine

## 2023-11-13 ENCOUNTER — Emergency Department (HOSPITAL_BASED_OUTPATIENT_CLINIC_OR_DEPARTMENT_OTHER)

## 2023-11-13 ENCOUNTER — Other Ambulatory Visit: Payer: Self-pay

## 2023-11-13 ENCOUNTER — Emergency Department (HOSPITAL_BASED_OUTPATIENT_CLINIC_OR_DEPARTMENT_OTHER): Admitting: Radiology

## 2023-11-13 DIAGNOSIS — R072 Precordial pain: Secondary | ICD-10-CM | POA: Diagnosis not present

## 2023-11-13 DIAGNOSIS — R609 Edema, unspecified: Secondary | ICD-10-CM

## 2023-11-13 DIAGNOSIS — E860 Dehydration: Secondary | ICD-10-CM | POA: Diagnosis not present

## 2023-11-13 DIAGNOSIS — R600 Localized edema: Secondary | ICD-10-CM | POA: Insufficient documentation

## 2023-11-13 DIAGNOSIS — I2510 Atherosclerotic heart disease of native coronary artery without angina pectoris: Secondary | ICD-10-CM | POA: Diagnosis not present

## 2023-11-13 DIAGNOSIS — I700 Atherosclerosis of aorta: Secondary | ICD-10-CM | POA: Diagnosis not present

## 2023-11-13 DIAGNOSIS — M7989 Other specified soft tissue disorders: Secondary | ICD-10-CM | POA: Insufficient documentation

## 2023-11-13 DIAGNOSIS — R0789 Other chest pain: Secondary | ICD-10-CM | POA: Diagnosis not present

## 2023-11-13 DIAGNOSIS — R079 Chest pain, unspecified: Secondary | ICD-10-CM | POA: Diagnosis not present

## 2023-11-13 LAB — CBC
HCT: 38.7 % (ref 36.0–46.0)
Hemoglobin: 13 g/dL (ref 12.0–15.0)
MCH: 30.1 pg (ref 26.0–34.0)
MCHC: 33.6 g/dL (ref 30.0–36.0)
MCV: 89.6 fL (ref 80.0–100.0)
Platelets: 268 K/uL (ref 150–400)
RBC: 4.32 MIL/uL (ref 3.87–5.11)
RDW: 13.2 % (ref 11.5–15.5)
WBC: 6.6 K/uL (ref 4.0–10.5)
nRBC: 0 % (ref 0.0–0.2)

## 2023-11-13 LAB — TROPONIN T, HIGH SENSITIVITY
Troponin T High Sensitivity: 15 ng/L (ref 0–19)
Troponin T High Sensitivity: 16 ng/L (ref 0–19)

## 2023-11-13 LAB — BASIC METABOLIC PANEL WITH GFR
Anion gap: 13 (ref 5–15)
BUN: 17 mg/dL (ref 8–23)
CO2: 25 mmol/L (ref 22–32)
Calcium: 9.4 mg/dL (ref 8.9–10.3)
Chloride: 105 mmol/L (ref 98–111)
Creatinine, Ser: 1.07 mg/dL — ABNORMAL HIGH (ref 0.44–1.00)
GFR, Estimated: 56 mL/min — ABNORMAL LOW (ref >60)
Glucose, Bld: 87 mg/dL (ref 70–99)
Potassium: 3.9 mmol/L (ref 3.5–5.1)
Sodium: 143 mmol/L (ref 135–145)

## 2023-11-13 MED ORDER — IOHEXOL 350 MG/ML SOLN
75.0000 mL | Freq: Once | INTRAVENOUS | Status: AC | PRN
  Administered 2023-11-13: 75 mL via INTRAVENOUS

## 2023-11-13 MED ORDER — LACTATED RINGERS IV BOLUS: 500.0000 mL | Freq: Once | INTRAVENOUS | Status: DC

## 2023-11-13 NOTE — ED Notes (Signed)
 Patient transported to CT

## 2023-11-13 NOTE — ED Triage Notes (Signed)
 Pt POV reporting persistent L side chest pain and leg swelling, seen for same in ED 8/29, no definitive dx, told to follow up with cardiologist, appt not until 25th.

## 2023-11-13 NOTE — Discharge Instructions (Addendum)
 Using a for chest pain and for some swelling to left arm and leg.  Suspecting that the swelling is likely secondary to peripheral vascular disease and from blood pooling in the legs and not from any specific heart failure or clot at this time.  Recommend continue to follow-up with your cardiologist as well as taking medications as prescribed including your Lasix .  Otherwise your physical exam, imaging and lab work were all very reassuring.  Your lab work did note that you might be mildly dehydrated, please be sure to continue to drink regular fluids.

## 2023-11-13 NOTE — ED Notes (Signed)
 Pt stated in lobby that she felt worse, continuing to vomit after zofran. VSS, moved to cubby until room becomes available.

## 2023-11-13 NOTE — ED Provider Notes (Signed)
 [place] EMERGENCY DEPARTMENT AT [facility] Provider Note   CSN: [id number] Arrival date & time: 11/13/23  1910     Patient presents with: Chest Pain   Beth Schmidt is a 68 y.o. female.   Chest Pain  Patient is a 68 year old female presenting ED today concerns for acute onset intermittent chest pain seen previously in the emergency department and had indeterminate BNP at that time, called cardiology who placed her on 20 mg of Lasix  for her complaints of edema in both her lower extremities and upper extremities.  Came today due to having an acute episode of chest pain left-sided described as sharp while she was walking her grandson to the car.  Was concerned and came in today.  Noting that the swelling has been made in the left leg and left arm.  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  Is wishing to have CT PE study done at this time.  Has follow-up with cardiology on the 25th.  Denies fever, headache, vision changes, shortness of breath, cough, congestion, abdominal pain, nausea, vomiting, diarrhea, dysuria, numbness, weakness, tingling.    Prior to Admission medications   Medication Sig Start Date End Date Taking? Authorizing Provider  Evolocumab  (REPATHA  SURECLICK) 140 MG/ML SOAJ Inject 140 mg into the skin every 14 (fourteen) days. 01/30/23   [place], Peter M, MD  furosemide  (LASIX ) 20 MG tablet Take 1 tablet (20 mg total) by mouth daily. 11/12/23 02/10/24  [place], Peter M, MD  naproxen  (NAPROSYN ) 500 MG tablet TAKE 1 TABLET BY MOUTH TWICE DAILY WITH A MEAL AS NEEDED FOR PAIN 07/08/23   [name], MD    Allergies: Crestor [rosuvastatin] and Lipitor [atorvastatin]    Review of Systems  Cardiovascular:  Positive for chest pain.  All other systems reviewed and are negative.   Updated Vital Signs BP (!) 145/80 (BP Location: Right Arm)   Pulse 60   Temp 98.2 F (36.8 C) (Oral)   Resp 18   Ht 5' 9 (1.753 m)   Wt  79.4 kg   SpO2 98%   BMI 25.84 kg/m   Physical Exam Vitals and nursing note reviewed.  Constitutional:      General: She is not in acute distress.    Appearance: Normal appearance. She is not ill-appearing or diaphoretic.  HENT:     Head: Normocephalic and atraumatic.  Eyes:     General: No scleral icterus.       Right eye: No discharge.        Left eye: No discharge.     Extraocular Movements: Extraocular movements intact.     Conjunctiva/sclera: Conjunctivae normal.  Cardiovascular:     Rate and Rhythm: Normal rate and regular rhythm.     Pulses: Normal pulses.     Heart sounds: Normal heart sounds. No murmur heard.    No friction rub. No gallop.  Pulmonary:     Effort: Pulmonary effort is normal. No respiratory distress.     Breath sounds: No stridor. No wheezing, rhonchi or rales.  Chest:     Chest wall: Tenderness (Mild chest wall tenderness noted to left side) present.  Abdominal:     General: Abdomen is flat. There is no distension.     Palpations: Abdomen is soft.     Tenderness: There is no abdominal tenderness. There is no right CVA tenderness, left CVA tenderness, guarding or rebound.  Musculoskeletal:  General: No swelling, deformity or signs of injury.     Cervical back: Normal range of motion. No rigidity.     Right lower leg: No edema.     Left lower leg: No edema.  Skin:    General: Skin is warm and dry.     Capillary Refill: Capillary refill takes less than 2 seconds.     Findings: No bruising, erythema or lesion.  Neurological:     General: No focal deficit present.     Mental Status: She is alert and oriented to person, place, and time. Mental status is at baseline.     Sensory: No sensory deficit.     Motor: No weakness.  Psychiatric:        Mood and Affect: Mood normal.     (all labs ordered are listed, but only abnormal results are displayed) Labs Reviewed  BASIC METABOLIC PANEL WITH GFR - Abnormal; Notable for the following components:       Result Value   Creatinine, Ser 1.07 (*)    GFR, Estimated 56 (*)    All other components within normal limits  CBC  TROPONIN T, HIGH SENSITIVITY  TROPONIN T, HIGH SENSITIVITY    EKG: EKG Interpretation Date/Time:  Wednesday November 13 2023 19:27:42 EDT Ventricular Rate:  91 PR Interval:  154 QRS Duration:  79 QT Interval:  349 QTC Calculation: 430 R Axis:   43  Text Interpretation: Sinus rhythm Low voltage, precordial leads Minimal ST depression, diffuse leads No significant change since last tracing Confirmed by Randol Simmonds [phone]) on 11/13/2023 7:33:06 PM  Radiology: CT Angio Chest PE W and/or Wo Contrast Result Date: 11/13/2023 EXAM: CTA of the Chest with contrast for PE 11/13/2023 10:48:51 PM TECHNIQUE: CTA of the chest was performed after the administration of intravenous contrast. Multiplanar reformatted images are provided for review. MIP images are provided for review. Automated exposure control, iterative reconstruction, and/or weight based adjustment of the mA/kV was utilized to reduce the radiation dose to as low as reasonably achievable. COMPARISON: Chest x-ray and CT chest 03/30/2020 and CT chest 03/18/2006. CLINICAL HISTORY: Pulmonary embolism (PE) suspected, high prob. Triage note:; Pt POV reporting persistent L side chest pain and leg swelling, seen for same in ED 8/29, no definitive dx, told to follow up with cardiologist, appt not until 25th. FINDINGS: PULMONARY ARTERIES: Pulmonary arteries are adequately opacified for evaluation. No pulmonary embolism. Main pulmonary artery is normal in caliber. MEDIASTINUM: The heart and pericardium demonstrate no acute abnormality. There is no acute abnormality of the thoracic aorta. Coronary artery and aortic atherosclerotic calcifications. LYMPH NODES: No mediastinal, hilar or axillary lymphadenopathy. LUNGS AND PLEURA: Irregular nodule in the right upper lobe is stable since 2008 and benign (series 9 image 82). No follow-up  recommended. Bibasilar atelectasis. No focal pneumonia. No pleural effusion or pneumothorax. UPPER ABDOMEN: Limited images of the upper abdomen are unremarkable. SOFT TISSUES AND BONES: No acute bone or soft tissue abnormality. IMPRESSION: 1. No pulmonary embolism. Electronically signed by: Norman Gatlin MD 11/13/2023 10:56 PM EDT RP Workstation: [id number]   DG Chest 2 View Result Date: 11/13/2023 CLINICAL DATA:  Chest pain. EXAM: CHEST - 2 VIEW COMPARISON:  Chest radiograph dated 02/29/2020. FINDINGS: No focal consolidation, pleural effusion, pneumothorax. The cardiac silhouette is within limits. Atherosclerotic calcification of the aorta. No acute osseous pathology. IMPRESSION: No active cardiopulmonary disease. Electronically Signed   By: Vanetta Chou M.D.   On: 11/13/2023 19:56    Procedures   Medications Ordered in the  ED  lactated ringers  bolus 500 mL (has no administration in time range)  iohexol  (OMNIPAQUE ) 350 MG/ML injection 75 mL (75 mLs Intravenous Contrast Given 11/13/23 2249)                                Medical Decision Making Amount and/or Complexity of Data Reviewed Labs: ordered. Radiology: ordered.  Risk Prescription drug management.   This patient is a 68 year old female who presents to the ED for concern of edema to left leg and left arm x 2 weeks accompanied with acute onset chest pain starting today that is currently abated at this time.   On physical exam, patient is in no acute distress, afebrile, alert and orient x 4, speaking in full sentences, nontachypneic, nontachycardic.  No edema was noted on exam to any extremity.  Pulses were strong bilaterally, 2+ radial, 2+ DP.  Normal sensation bilaterally, no abdominal tenderness to palpation, noted to have some mild chest wall tenderness to palpation but otherwise unremarkable exam.  Patient is requesting CT PE study, which was done and was shown to be negative, with all the labs unremarkable despite showing  some mild dehydration on BMP.  Do not believe need to repeat BMP as patient's imaging does not show any effusions.  Not have any shortness of breath.  Will have her continue to follow-up with her cardiologist with already scheduled appointment.  Patient vital signs have remained stable throughout the course of patient's time in the ED. Low suspicion for any other emergent pathology at this time. I believe this patient is safe to be discharged. Provided strict return to ER precautions. Patient expressed agreement and understanding of plan. All questions were answered.  Differential diagnoses prior to evaluation: The emergent differential diagnosis includes, but is not limited to, ACS, AAS, Pulmonary Embolism, Tension Pneumothorax, Esophageal Rupture, Cardiac Tamponade, Pericarditis, Myocarditis, Pneumothorax, Pneumonia, Aortic Stenosis, CHF Exacerbation, GERD,  Esophageal Spasm,  Mallory-Weiss, Costochondritis, Musculoskeletal Chest Wall Pain, Anxiety / Panic Attack, DVT, PVD, PAD, venous stasis, cellulitis. This is not an exhaustive differential.   Past Medical History / Co-morbidities / Social History: Atrial fibrillation, anxiety, SVT, HLD, palpitations  Additional history: Chart reviewed. Pertinent results include:   Last seen in the ED on 11/01/2023 for edema.  Noted to have sensations of feet, ankles, hands being swollen.  Ongoing x 4 days at that time and noting generalized bodyaches.  No obvious edema noted on exam though.  BNP noted to be in indeterminate range with not suspecting heart failure.  Was told to follow-up with PCP.  Provided Benadryl .  Lab Tests/Imaging studies: I personally interpreted labs/imaging and the pertinent results include:    CBC unremarkable BMP notes a mildly elevated creatinine and decreased GFR when compared to previous Troponin 15 Chest x-ray unremarkable CT PE study shows no signs of pulmonary [place]  I agree with the radiologist interpretation.  Cardiac  monitoring: EKG obtained and interpreted by myself and attending physician which shows: Sinus rhythm  EKG Interpretation Date/Time:  Wednesday November 13 2023 19:27:42 EDT Ventricular Rate:  91 PR Interval:  154 QRS Duration:  79 QT Interval:  349 QTC Calculation: 430 R Axis:   43  Text Interpretation: Sinus rhythm Low voltage, precordial leads Minimal ST depression, diffuse leads No significant change since last tracing Confirmed by Randol Simmonds [phone]) on 11/13/2023 7:33:06 PM          Medications:  I have reviewed the patients  home medicines and have made adjustments as needed.  Critical Interventions: None  Social Determinants of Health: Has good follow-up with cardiologist  Disposition: After consideration of the diagnostic results and the patients response to treatment, I feel that the patient would benefit from discharge and treatment as above.   emergency department workup does not suggest an emergent condition requiring admission or immediate intervention beyond what has been performed at this time. The plan is: Follow-up with cardiologist, continue taking medication as prescribed, return for new or worsening symptoms. The patient is safe for discharge and has been instructed to return immediately for worsening symptoms, change in symptoms or any other concerns.  Final diagnoses:  Precordial pain  Edema, unspecified type    ED Discharge Orders     None          Beola Terrall GORMAN DEVONNA 11/13/23 2326    Randol Simmonds, MD 11/15/23 [phone]

## 2023-11-26 ENCOUNTER — Ambulatory Visit: Admitting: Cardiology

## 2023-11-27 NOTE — Progress Notes (Unsigned)
 Cardiology Office Note    Date:  11/28/2023  ID:  Beth Schmidt, Beth Schmidt 21-Sep-1955, MRN [id number] PCP:  Beth Schmidt, Vyvyan, MD  Cardiologist:  Beth Swaziland, MD  Electrophysiologist:  None   Chief Complaint: Follow up for chest pain, shortness of breath, lower extremity edema  History of Present Illness: .    Beth Schmidt is a 68 y.o. female with visit-pertinent history of mild nonobstructive CAD noted on coronary CTA in 03/2020, paroxysmal atrial fibrillation not on anticoagulation, paroxysmal atrial tachycardia, hyperlipidemia intolerant to statins and rheumatic fever, she has been followed by Dr. Swaziland.  Patient was remotely seen by Dr. Dominick and Dr. Tisa and now follows with Dr. Swaziland.  Cardiac catheterization in 2007 showed normal coronaries.  Patient has history of paroxysmal atrial fibrillation and atrial tachycardia and previously saw Dr. Fernande for this.  She has been refractory to multiple medications including flecainide, propafenone and Multaq.  As her CHADS2 Vascor was only 2 patient has not been on any anticoagulation.  Patient was referred to Dr. Vaughn in 03/2020 after ED visit for chest pain.  EKG in the ED showed no acute changes and troponin was negative.  She continued to have progressive symptoms following ED visit.  Coronary CT was ordered for further evaluation and showed a coronary calcium score of 19, 62nd percentile for age and sex which mild nonobstructive CAD.  Patient was last in clinic on 01/10/2023 by Dr. Swaziland.  She reported that she was doing very well at the time with no chest pain or dyspnea, noted very rare self-limited palpitations.  On chart review on 11/01/2023 patient presented to the emergency department complaining of sensation that her lower extremities and hands were swollen that been ongoing for 4 to 5 days.  Patient felt as though she had gained about 5 pounds in a week patient's BNP was 134.7, per notes felt that she was having an allergic reaction.   She was treated with diphenhydramine .  On 11/13/2023 patient again presented the ED with acute onset intermittent chest pain.  Patient reported an episode of left-sided chest pain that was sharp while she was walking her grandson to the car, noted that she had had swelling in the left leg and the left arm and was concerned for DVTs as she notes she had these in her 57s.  Per notes on exam patient was without edema, CT PE study was negative.  Troponin was negative x 2.  Today she presents for follow-up.  She reports that her swelling has recently resolved.  Patient reports that she was having increased swelling in her hands and her lower extremities, improved with intermittent use of Lasix .  Patient reports that when she had increased swelling she also had some intermittent shortness of breath.  Patient also reports some occasional chest discomfort that only occurs when sitting, notes a deep aching sensation in the midsternal chest that last for a minute.  She denies any chest pain or pressure on exertion.  She does note occasional palpitations described as a skipped beat, denies any significant changes.  Labwork independently reviewed: 11/13/2023: Sodium 143, potassium 3.9, creatinine 1.07, hemoglobin 13, hematocrit 38.7 ROS: .   Today she denies fatigue, palpitations, melena, hematuria, hemoptysis, diaphoresis, weakness, presyncope, syncope, orthopnea, and PND.  All other systems are reviewed and otherwise negative. Studies Reviewed: Beth Schmidt   EKG:  EKG is not ordered today.  CV Studies: Cardiac studies reviewed are outlined and summarized above. Otherwise please see EMR for full report.  Cardiac Studies & Procedures   ______________________________________________________________________________________________          CT SCANS  CT CORONARY MORPH W/CTA COR W/SCORE 03/30/2020  Addendum 03/30/2020 10:40 PM ADDENDUM REPORT: 03/30/2020 22:37  CLINICAL DATA:  69F with chest  pain  EXAM: Cardiac/Coronary CTA  TECHNIQUE: The patient was scanned on a [employer].  FINDINGS: A 100 kV prospective scan was triggered in the descending thoracic aorta at 111 HU's. Axial non-contrast 3 mm slices were carried out through the heart. The data set was analyzed on a dedicated work station and scored using the Agatson method. Gantry rotation speed was 250 msecs and collimation was .6 mm. No beta blockade and 0.8 mg of sl NTG was given. The 3D data set was reconstructed in 5% intervals of the 35-75 % of the R-R cycle. Phases were analyzed on a dedicated work station using MPR, MIP and VRT modes. The patient received 80 cc of contrast.  Coronary Arteries:  Normal coronary origin.  Right dominance.  RCA is a large dominant artery.  There is no plaque.  Left main is a large artery that gives rise to LAD and LCX arteries.  LAD is a large vessel. There is mixed plaque in the proximal LAD causing mild (25-49%) stenosis. There is calcified plaque in the mid LAD causes minimal (0-24%) stenosis. Noncalcified plaque in the mid LAD causes minimal (0-24%) stenosis.  LCX is a non-dominant artery that gives rise to one large OM1 branch. There is no plaque.  Other findings:  Left Ventricle: Normal size  Left Atrium: Mild enlargement  Pulmonary Veins: Normal configuration  Right Ventricle: Normal size  Right Atrium: Mild enlargement  Cardiac valves: No calcifications  Thoracic aorta: Normal size  Pulmonary Arteries: Normal size  Systemic Veins: Normal drainage  Pericardium: Normal thickness  IMPRESSION: 1. Coronary calcium score of 19. This was 62nd percentile for age and sex matched control.  2. Nonobstructive CAD  3. Mixed plaque in the proximal LAD causes mild (25-49%) stenosis, calcified plaque in the mid LAD causes minimal (0-24%) stenosis, and noncalcified plaque in the mid LAD causes minimal (0-24%) stenosis.  CAD-RADS 2. Mild  non-obstructive CAD (25-49%). Consider non-atherosclerotic causes of chest pain. Consider preventive therapy and risk factor modification.   Electronically Signed By: Beth Nanas MD On: 03/30/2020 22:37  Narrative EXAM: OVER-READ INTERPRETATION  CT CHEST  The following report is an over-read performed by radiologist Dr. Toribio Aye of [facility] on 03/30/2020. This over-read does not include interpretation of cardiac or coronary anatomy or pathology. The coronary calcium score/coronary CTA interpretation by the cardiologist is attached.  COMPARISON:  Chest CT 02/06/2006.  FINDINGS: Aortic atherosclerosis. Within the visualized portions of the thorax there are no suspicious appearing pulmonary nodules or masses, there is no acute consolidative airspace disease, no pleural effusions, no pneumothorax and no lymphadenopathy. Visualized portions of the upper abdomen are unremarkable. There are no aggressive appearing lytic or blastic lesions noted in the visualized portions of the skeleton.  IMPRESSION: 1.  Aortic Atherosclerosis (ICD10-I70.0).  Electronically Signed: By: Toribio Aye M.D. On: 03/30/2020 12:21     ______________________________________________________________________________________________       Current Reported Medications:.    Current Meds  Medication Sig   Evolocumab  (REPATHA  SURECLICK) 140 MG/ML SOAJ Inject 140 mg into the skin every 14 (fourteen) days.   furosemide  (LASIX ) 20 MG tablet Take 1 tablet (20 mg total) by mouth daily.   metoprolol  tartrate (LOPRESSOR ) 100 MG tablet Take 1 tablet (  100 mg total) by mouth once for 1 dose. Take 90-120 minutes prior to scan. Hold for SBP less than 110.    Physical Exam:    VS:  BP 126/70 (BP Location: Left Arm, Patient Position: Sitting, Cuff Size: Normal)   Pulse 81   Ht 5' 9 (1.753 m)   Wt 170 lb 3.2 oz (77.2 kg)   SpO2 99%   BMI 25.13 kg/m    Wt Readings from Last 3  Encounters:  11/28/23 170 lb 3.2 oz (77.2 kg)  11/13/23 175 lb (79.4 kg)  01/10/23 177 lb (80.3 kg)    GEN: Well nourished, well developed in no acute distress NECK: No JVD; No carotid bruits CARDIAC: RRR, no murmurs, rubs, gallops RESPIRATORY:  Clear to auscultation without rales, wheezing or rhonchi  ABDOMEN: Soft, non-tender, non-distended EXTREMITIES:  No edema; No acute deformity     Asessement and Plan:.    Non obstructive CAD/precordial pain: Coronary CTA in 03/2020 showed a coronary calcium score of 19 (62nd percentile for age and sex) with mild non-obstructive CAD (25-49%). Today she reports intermittent episodes of an aching in the midsternal chest that last for around a minute, not associated with exertion.  Overall sounds atypical for CAD however given history of nonobstructive CAD on coronary CTA in 03/2020 will repeat coronary CTA as she also has noted some increased shortness of breath.  Patient would not be able to complete treadmill stress test.  She is agreeable to taking the Toprol  tartrate 100 mg prior to testing, discussed use of nitroglycerin  for testing.  Reviewed ED precautions. Check CMET and fasting lipid profile.   Lower extremity edema/shortness of breath/history of rheumatic fever: Patient notes that she had rheumatic fever as a child, recently with episode of increased lower extremity edema and swelling in her hands with associated shortness of breath.  She notes significant proved meant with use of intermittent Lasix .  Check echocardiogram.   Palpitations: Patient with a history of atrial tachycardia.  Patient has been refractory to multiple medications including flecainide, propafenone and Multaq.  She notes occasional palpitations described as a skipped beat sensation.  She has been maintaining sinus rhythm.  She will continue to monitor.   Hyperlipidemia: Patient without recent fasting lipid profile.  Check fasting lipid profile and CMET today.    Disposition:  F/u with Mallerie Blok, NP in 6-8 weeks.   Signed, Nealy Hickmon D Davontae Prusinski, NP

## 2023-11-28 ENCOUNTER — Encounter: Payer: Self-pay | Admitting: Cardiology

## 2023-11-28 ENCOUNTER — Ambulatory Visit: Attending: Cardiology | Admitting: Cardiology

## 2023-11-28 VITALS — BP 126/70 | HR 81 | Ht 69.0 in | Wt 170.2 lb

## 2023-11-28 DIAGNOSIS — R0602 Shortness of breath: Secondary | ICD-10-CM

## 2023-11-28 DIAGNOSIS — R002 Palpitations: Secondary | ICD-10-CM | POA: Diagnosis not present

## 2023-11-28 DIAGNOSIS — E782 Mixed hyperlipidemia: Secondary | ICD-10-CM

## 2023-11-28 DIAGNOSIS — I2510 Atherosclerotic heart disease of native coronary artery without angina pectoris: Secondary | ICD-10-CM

## 2023-11-28 DIAGNOSIS — M7989 Other specified soft tissue disorders: Secondary | ICD-10-CM

## 2023-11-28 DIAGNOSIS — R072 Precordial pain: Secondary | ICD-10-CM | POA: Diagnosis not present

## 2023-11-28 MED ORDER — METOPROLOL TARTRATE 100 MG PO TABS: 100.0000 mg | ORAL_TABLET | Freq: Once | ORAL | 0 refills | Status: AC

## 2023-11-28 NOTE — Patient Instructions (Addendum)
 Medication Instructions:  Your physician recommends that you continue on your current medications as directed. Please refer to the Current Medication list given to you today.  *If you need a refill on your cardiac medications before your next appointment, please call your pharmacy*  Lab Work: TOADY:  CMET & LIPID  If you have labs (blood work) drawn today and your tests are completely normal, you will receive your results only by: MyChart Message (if you have MyChart) OR A paper copy in the mail If you have any lab test that is abnormal or we need to change your treatment, we will call you to review the results.  Testing/Procedures: Your physician has requested that you have cardiac CT. Cardiac computed tomography (CT) is a painless test that uses an x-ray machine to take clear, detailed pictures of your heart. For further information please visit [email or web address] Please follow instruction sheet BELOW:    Your cardiac CT will be scheduled at one of the below locations:   [facility] [address] [place], [place] [address] [phone] (Severe contrast allergies only)  OR   [facility] [address] [place], [place] [address] [phone]  OR   [facility] [address] [place], [place] [address] [phone]  OR   [facility] [address]  [place], [place] [address] [phone]  OR   [facility] [address] [place], [place] [phone]  If scheduled at [facility], please arrive at the [facility] and Children's Entrance (Entrance C2) of [facility] 30 minutes prior to test start time. You can use the FREE valet parking offered at entrance C (encouraged to control the heart rate for the test)  Proceed to the [facility] Radiology Department (first floor) to check-in and test prep.  All radiology patients and guests should use entrance C2 at [facility], accessed from [facility], even though the hospital's physical address listed is [address].  If scheduled at the Heart and Vascular Tower at [address], please enter the parking lot using the [address] entrance and use the FREE valet service at the patient drop-off area. Enter the building and check-in with registration on the main floor.  If scheduled at [facility], please arrive to the Heart and Vascular Center 15 mins early for check-in and test prep.  There is spacious parking and easy access to the radiology department from the [facility] Heart and Vascular entrance. Please enter here and check-in with the desk attendant.   If scheduled at [facility], please arrive 30 minutes early for check-in and test prep.  Please follow these instructions carefully (unless otherwise directed):  An IV will be required for this test and Nitroglycerin  will be given.  Hold all erectile dysfunction medications at least 3 days (72 hrs) prior to test. (Ie viagra, cialis, sildenafil, tadalafil, etc)   On the Night Before the Test: Be sure to Drink plenty of water. Do not consume any caffeinated/decaffeinated beverages or chocolate 12 hours prior to your test. Do not take any antihistamines 12 hours prior to your test.  On the Day of the Test: Drink plenty of water until 1 hour prior to the test. Do not eat any food 1 hour prior to test. You may take your regular medications prior to the test.  Take metoprolol  (Lopressor ) 100 MG two hours prior to test. THIS HAS BEEN SENT  TO [facility]- please wear underwire-free bra if available, avoid dresses & tight clothing       After the Test: Drink plenty of water. After receiving IV contrast, you may experience a mild flushed feeling. This is normal. On occasion, you may experience a mild rash up to 24 hours after the test. This is not dangerous. If this occurs, you can take  Benadryl  25 mg, Zyrtec, Claritin, or Allegra and increase your fluid intake. (Patients taking Tikosyn should avoid Benadryl , and may take Zyrtec, Claritin, or Allegra) If you experience trouble breathing, this can be serious. If it is severe call 911 IMMEDIATELY. If it is mild, please call our office.  We will call to schedule your test 2-4 weeks out understanding that some insurance companies will need an authorization prior to the service being performed.   For more information and frequently asked questions, please visit our website : [email or web address]  For non-scheduling related questions, please contact the cardiac imaging nurse navigator should you have any questions/concerns: Cardiac Imaging Nurse Navigators Direct Office Dial: [phone]   For scheduling needs, including cancellations and rescheduling, please call [place], [phone].  Follow-Up: At [facility], you and your health needs are our priority.  As part of our continuing mission to provide you with exceptional heart care, our providers are all part of one team.  This team includes your primary Cardiologist (physician) and Advanced Practice Providers or APPs (Physician Assistants and Nurse Practitioners) who all work together to provide you with the care you need, when you need it.  Your next appointment:   6-8 week(s)  Provider:   Katlyn West, NP          We recommend signing up for the patient portal called MyChart.  Sign up information is provided on this After Visit Summary.  MyChart is used to connect with patients for Virtual Visits (Telemedicine).  Patients are able to view lab/test results, encounter notes, upcoming appointments, etc.  Non-urgent messages can be sent to your provider as well.   To learn more about what you can do with MyChart, go to [email or web address].   Other Instructions

## 2023-11-29 ENCOUNTER — Ambulatory Visit (HOSPITAL_BASED_OUTPATIENT_CLINIC_OR_DEPARTMENT_OTHER): Payer: Self-pay | Admitting: Cardiology

## 2023-11-29 LAB — COMPREHENSIVE METABOLIC PANEL WITH GFR
ALT: 10 IU/L (ref 0–32)
AST: 18 IU/L (ref 0–40)
Albumin: 4.3 g/dL (ref 3.9–4.9)
Alkaline Phosphatase: 86 IU/L (ref 49–135)
BUN/Creatinine Ratio: 18 (ref 12–28)
BUN: 18 mg/dL (ref 8–27)
Bilirubin Total: 0.6 mg/dL (ref 0.0–1.2)
CO2: 23 mmol/L (ref 20–29)
Calcium: 9 mg/dL (ref 8.7–10.3)
Chloride: 105 mmol/L (ref 96–106)
Creatinine, Ser: 1.01 mg/dL — ABNORMAL HIGH (ref 0.57–1.00)
Globulin, Total: 2.1 g/dL (ref 1.5–4.5)
Glucose: 83 mg/dL (ref 70–99)
Potassium: 5 mmol/L (ref 3.5–5.2)
Sodium: 142 mmol/L (ref 134–144)
Total Protein: 6.4 g/dL (ref 6.0–8.5)
eGFR: 60 mL/min/1.73 (ref >59)

## 2023-11-29 LAB — LIPID PANEL
Chol/HDL Ratio: 2.5 ratio (ref 0.0–4.4)
Cholesterol, Total: 134 mg/dL (ref 100–199)
HDL: 53 mg/dL (ref >39)
LDL Chol Calc (NIH): 64 mg/dL (ref 0–99)
Triglycerides: 92 mg/dL (ref 0–149)
VLDL Cholesterol Cal: 17 mg/dL (ref 5–40)

## 2023-11-29 NOTE — Progress Notes (Signed)
 Attempted to call patient no answer, left a vm to call back.

## 2023-11-29 NOTE — Telephone Encounter (Signed)
 Patient is returning call.

## 2023-11-29 NOTE — Progress Notes (Signed)
 Called patient back and notified of the result and verbalized understanding. All questions (if any) were answered.

## 2023-12-02 ENCOUNTER — Ambulatory Visit (HOSPITAL_COMMUNITY)
Admission: RE | Admit: 2023-12-02 | Discharge: 2023-12-02 | Disposition: A | Source: Ambulatory Visit | Attending: Cardiology

## 2023-12-02 DIAGNOSIS — R609 Edema, unspecified: Secondary | ICD-10-CM | POA: Insufficient documentation

## 2023-12-02 DIAGNOSIS — M7989 Other specified soft tissue disorders: Secondary | ICD-10-CM

## 2023-12-02 DIAGNOSIS — I083 Combined rheumatic disorders of mitral, aortic and tricuspid valves: Secondary | ICD-10-CM | POA: Insufficient documentation

## 2023-12-02 DIAGNOSIS — R0602 Shortness of breath: Secondary | ICD-10-CM | POA: Insufficient documentation

## 2023-12-02 LAB — ECHOCARDIOGRAM COMPLETE
Area-P 1/2: 2.16 cm2
Calc EF: 66 %
MV VTI: 1.67 cm2
S' Lateral: 2.3 cm
Single Plane A2C EF: 68.5 %
Single Plane A4C EF: 65.7 %

## 2023-12-02 NOTE — Progress Notes (Signed)
  Echocardiogram 2D Echocardiogram has been performed.  Beth Schmidt 12/02/2023, 9:25 AM

## 2023-12-03 ENCOUNTER — Other Ambulatory Visit: Payer: Self-pay | Admitting: Cardiovascular Disease

## 2023-12-03 ENCOUNTER — Ambulatory Visit (HOSPITAL_COMMUNITY)
Admission: RE | Admit: 2023-12-03 | Discharge: 2023-12-03 | Disposition: A | Discharge status: Home / Self Care | Source: Ambulatory Visit | Attending: Cardiology | Admitting: Cardiology

## 2023-12-03 ENCOUNTER — Ambulatory Visit (HOSPITAL_COMMUNITY)
Admission: RE | Admit: 2023-12-03 | Discharge: 2023-12-03 | Disposition: A | Discharge status: Home / Self Care | Source: Ambulatory Visit | Attending: Cardiovascular Disease | Admitting: Cardiovascular Disease

## 2023-12-03 DIAGNOSIS — R072 Precordial pain: Secondary | ICD-10-CM | POA: Insufficient documentation

## 2023-12-03 DIAGNOSIS — R931 Abnormal findings on diagnostic imaging of heart and coronary circulation: Secondary | ICD-10-CM

## 2023-12-03 DIAGNOSIS — I2510 Atherosclerotic heart disease of native coronary artery without angina pectoris: Secondary | ICD-10-CM | POA: Diagnosis not present

## 2023-12-03 DIAGNOSIS — Q2112 Patent foramen ovale: Secondary | ICD-10-CM | POA: Insufficient documentation

## 2023-12-03 MED ORDER — IOHEXOL 350 MG/ML SOLN
100.0000 mL | Freq: Once | INTRAVENOUS | Status: AC | PRN
Start: 2023-12-03 — End: 2023-12-03
  Administered 2023-12-03: 100 mL via INTRAVENOUS

## 2023-12-03 MED ORDER — NITROGLYCERIN 0.4 MG SL SUBL
0.8000 mg | SUBLINGUAL_TABLET | Freq: Once | SUBLINGUAL | Status: AC
  Administered 2023-12-03: 0.8 mg via SUBLINGUAL

## 2023-12-03 NOTE — Progress Notes (Signed)
 CT FFR ordered.  Signed, Darryle DASEN. Barbaraann, MD, [facility]  [facility]  [address] [place], [place] [address] [phone]  8:44 PM

## 2023-12-06 ENCOUNTER — Other Ambulatory Visit: Payer: Self-pay | Admitting: Cardiology

## 2024-01-13 ENCOUNTER — Telehealth: Payer: Self-pay | Admitting: Cardiology

## 2024-01-13 ENCOUNTER — Ambulatory Visit: Admitting: Cardiology

## 2024-01-13 NOTE — Telephone Encounter (Signed)
 Pt c/o medication issue:  1. Name of Medication: Evolocumab  (REPATHA  SURECLICK) 140 MG/ML SOAJ   2. How are you currently taking this medication (dosage and times per day)? As written   3. Are you having a reaction (difficulty breathing--STAT)? No   4. What is your medication issue? Pt needed to re certify for [employer] grant last updated 03/26/23     Pt would like a c/b once its completed and current pharmacy [id number] [place] ([place]), [place] - [address] please advise

## 2024-01-14 ENCOUNTER — Telehealth: Payer: Self-pay | Admitting: Pharmacy Technician

## 2024-01-14 NOTE — Telephone Encounter (Signed)
     Will have to wait until 02/18/24 to re-enroll. Current grant still good

## 2024-01-14 NOTE — Telephone Encounter (Signed)
    Will have to wait until 02/18/24 to re-enroll. Current grant still good

## 2024-01-16 NOTE — Telephone Encounter (Signed)
 Spoke to patient advised she cannot re enroll until 02/18/24.

## 2024-01-23 NOTE — Progress Notes (Deleted)
 Cardiology Office Note    Date:  01/23/2024  ID:  Senai, Ramnath Jan 28, 1956, MRN [id number] PCP:  Sun, Vyvyan, MD  Cardiologist:  Peter Jordan, MD  Electrophysiologist:  None   Chief Complaint: ***  History of Present Illness: .    Beth Schmidt is a 67 y.o. female with visit-pertinent history of mild nonobstructive CAD noted on coronary CTA in 03/2020, paroxysmal atrial fibrillation not on anticoagulation, paroxysmal atrial tachycardia, hyperlipidemia intolerant to statins and rheumatic fever, she has been followed by Dr. Jordan.   Patient was remotely seen by Dr. Dominick and Dr. Tisa and now follows with Dr. Jordan.  Cardiac catheterization in 2007 showed normal coronaries.  Patient has history of paroxysmal atrial fibrillation and atrial tachycardia and previously saw Dr. Fernande for this.  She has been refractory to multiple medications including flecainide, propafenone and Multaq.  As her CHADS2 Vascor was only 2 patient has not been on any anticoagulation.  Patient was referred to Dr. Vaughn in 03/2020 after ED visit for chest pain.  EKG in the ED showed no acute changes and troponin was negative.  She continued to have progressive symptoms following ED visit.  Coronary CT was ordered for further evaluation and showed a coronary calcium score of 19, 62nd percentile for age and sex which mild nonobstructive CAD.   Patient was last in clinic on 01/10/2023 by Dr. Jordan.  She reported that she was doing very well at the time with no chest pain or dyspnea, noted very rare self-limited palpitations.   On chart review on 11/01/2023 patient presented to the emergency department complaining of sensation that her lower extremities and hands were swollen that been ongoing for 4 to 5 days.  Patient felt as though she had gained about 5 pounds in a week patient's BNP was 134.7, per notes felt that she was having an allergic reaction.  She was treated with diphenhydramine .   On 11/13/2023  patient again presented the ED with acute onset intermittent chest pain.  Patient reported an episode of left-sided chest pain that was sharp while she was walking her grandson to the car, noted that she had had swelling in the left leg and the left arm and was concerned for DVTs as she notes she had these in her 62s.  Per notes on exam patient was without edema, CT PE study was negative.  Troponin was negative x 2.    Patient was last seen in clinic on 11/28/2023 for follow-up.  Patient reported that her swelling had resolved.  She reported she had had intermittent shortness of breath associated with swelling.  She also reported occasional chest discomfort that occurred when sitting, noted a deep aching sensation in midsternal chest that lasted for a minute.  Coronary CTA and echocardiogram was ordered for further evaluation.  Echocardiogram on 12/02/2023 indicated LVEF 6065%, no RWMA, diastolic parameters consistent with grade 2 diastolic dysfunction, RV systolic function and size was normal, normal PASP, trivial mitral valve regurgitation, mild mitral stenosis, aortic valve regurgitation not visualized, aortic sclerosis was present without evidence of stenosis.  Coronary CTA indicated coronary calcium score of 43.8, 61st percentile for age, sex and race matched control, mild to moderate mixed density plaque in the proximal LAD, negative by CT FFR with recommendation for medical management.  Today she presents for follow-up.  She reports that she  Nonobstructive CAD: Coronary CTA on Lower extremity edema, history of rheumatic fever: Palpitations: Patient with history of atrial tachycardia.  Patient has  been refractory to multiple medications including flecainide, propafenone and Multaq. Today she reports Hyperlipidemia: Last lipid profile on 11/28/2023 indicated cholesterol 134, HDL 53, triglycerides 92 and LDL 64.  Labwork independently reviewed:   ROS: .   *** denies chest pain, shortness of  breath, lower extremity edema, fatigue, palpitations, melena, hematuria, hemoptysis, diaphoresis, weakness, presyncope, syncope, orthopnea, and PND.  All other systems are reviewed and otherwise negative.  Studies Reviewed: [name]    EKG:  EKG is ordered today, personally reviewed, demonstrating ***     CV Studies: Cardiac studies reviewed are outlined and summarized above. Otherwise please see EMR for full report. Cardiac Studies & Procedures   ______________________________________________________________________________________________     ECHOCARDIOGRAM  ECHOCARDIOGRAM COMPLETE 12/02/2023  Narrative ECHOCARDIOGRAM REPORT    Patient Name:   Beth Schmidt Date of Exam: 12/02/2023 Medical Rec #:  [id number]        Height:       69.0 in Accession #:    [id number]       Weight:       170.2 lb Date of Birth:  [birth year]        BSA:          1.929 m Patient Age:    68 years         BP:           126/70 mmHg Patient Gender: F                HR:           76 bpm. Exam Location:  Outpatient  Procedure: 2D Echo (Both Spectral and Color Flow Doppler were utilized during procedure).  Indications:    SOB Edema  History:        Patient has no prior history of Echocardiogram examinations. Signs/Symptoms:Shortness of Breath and Edema.  Sonographer:    Norleen Amour Referring Phys: [id number] Maximiliano Cromartie D Mando Blatz  IMPRESSIONS   1. Left ventricular ejection fraction, by estimation, is 60 to 65%. The left ventricle has normal function. The left ventricle has no regional wall motion abnormalities. Left ventricular diastolic parameters are consistent with Grade II diastolic dysfunction (pseudonormalization). Elevated left atrial pressure. 2. Right ventricular systolic function is normal. The right ventricular size is normal. There is normal pulmonary artery systolic pressure. The estimated right ventricular systolic pressure is 27.6 mmHg. 3. The mitral valve is rheumatic. Trivial mitral valve  regurgitation. Mild mitral stenosis. The mean mitral valve gradient is 3.0 mmHg with average heart rate of 62 bpm. MVA 1.95cm^2 by PHT, 1.67cm^2 by continuity equation, consistent with mild MS. 4. The aortic valve is tricuspid. Aortic valve regurgitation is not visualized. Aortic valve sclerosis is present, with no evidence of aortic valve stenosis. 5. Left atrial size was mildly dilated. 6. The inferior vena cava is normal in size with greater than 50% respiratory variability, suggesting right atrial pressure of 3 mmHg.  FINDINGS Left Ventricle: Left ventricular ejection fraction, by estimation, is 60 to 65%. The left ventricle has normal function. The left ventricle has no regional wall motion abnormalities. The left ventricular internal cavity size was normal in size. There is no left ventricular hypertrophy. Left ventricular diastolic parameters are consistent with Grade II diastolic dysfunction (pseudonormalization). Elevated left atrial pressure.  Right Ventricle: The right ventricular size is normal. No increase in right ventricular wall thickness. Right ventricular systolic function is normal. There is normal pulmonary artery systolic pressure. The tricuspid regurgitant velocity is 2.48 m/s, and with an assumed right atrial  pressure of 3 mmHg, the estimated right ventricular systolic pressure is 27.6 mmHg.  Left Atrium: Left atrial size was mildly dilated.  Right Atrium: Right atrial size was normal in size.  Pericardium: Trivial pericardial effusion is present.  Mitral Valve: The mitral valve is rheumatic. Trivial mitral valve regurgitation. Mild mitral valve stenosis. MV peak gradient, 8.1 mmHg. The mean mitral valve gradient is 3.0 mmHg with average heart rate of 62 bpm.  Tricuspid Valve: The tricuspid valve is normal in structure. Tricuspid valve regurgitation is mild.  Aortic Valve: The aortic valve is tricuspid. Aortic valve regurgitation is not visualized. Aortic valve sclerosis  is present, with no evidence of aortic valve stenosis.  Pulmonic Valve: The pulmonic valve was not well visualized. Pulmonic valve regurgitation is trivial.  Aorta: The aortic root and ascending aorta are structurally normal, with no evidence of dilitation.  Venous: The inferior vena cava is normal in size with greater than 50% respiratory variability, suggesting right atrial pressure of 3 mmHg.  IAS/Shunts: The atrial septum is grossly normal.   LEFT VENTRICLE PLAX 2D LVIDd:         5.00 cm     Diastology LVIDs:         2.30 cm     LV e' medial:    8.83 cm/s LV PW:         0.80 cm     LV E/e' medial:  14.8 LV IVS:        0.70 cm     LV e' lateral:   6.48 cm/s LVOT diam:     2.10 cm     LV E/e' lateral: 20.2 LV SV:         81 LV SV Index:   42 LVOT Area:     3.46 cm  LV Volumes (MOD) LV vol d, MOD A2C: 74.5 ml LV vol d, MOD A4C: 83.0 ml LV vol s, MOD A2C: 23.5 ml LV vol s, MOD A4C: 28.5 ml LV SV MOD A2C:     51.0 ml LV SV MOD A4C:     83.0 ml LV SV MOD BP:      52.4 ml  RIGHT VENTRICLE             IVC RV Basal diam:  3.60 cm     IVC diam: 1.50 cm RV S prime:     10.30 cm/s TAPSE (M-mode): 2.3 cm  LEFT ATRIUM             Index        RIGHT ATRIUM           Index LA diam:        3.20 cm 1.66 cm/m   RA Area:     12.50 cm LA Vol (A2C):   63.0 ml 32.66 ml/m  RA Volume:   25.90 ml  13.43 ml/m LA Vol (A4C):   83.6 ml 43.34 ml/m LA Biplane Vol: 75.2 ml 38.98 ml/m AORTIC VALVE             PULMONIC VALVE LVOT Vmax:   100.00 cm/s PV Vmax:       0.76 m/s LVOT Vmean:  73.300 cm/s PV Peak grad:  2.3 mmHg LVOT VTI:    0.235 m  AORTA Ao Root diam: 3.00 cm Ao Asc diam:  3.20 cm  MITRAL VALVE                TRICUSPID VALVE MV Area (PHT): 2.16 cm  TR Peak grad:   24.6 mmHg MV Area VTI:   1.67 cm     TR Vmax:        248.00 cm/s MV Peak grad:  8.1 mmHg MV Mean grad:  3.0 mmHg     SHUNTS MV Vmax:       1.42 m/s     Systemic VTI:  0.24 m MV Vmean:      78.4 cm/s     Systemic Diam: 2.10 cm MV Decel Time: 351 msec MV E velocity: 131.00 cm/s MV A velocity: 135.00 cm/s MV E/A ratio:  0.97  Lonni Nanas MD Electronically signed by Lonni Nanas MD Signature Date/Time: 12/02/2023/1:26:56 PM    Final      CT SCANS  CT CORONARY MORPH W/CTA COR W/SCORE 12/03/2023  Addendum 12/11/2023  1:40 AM ADDENDUM REPORT: 12/11/2023 01:37  EXAM: OVER-READ INTERPRETATION  CT CHEST  The following report is an over-read performed by radiologist Dr. Suzen Dials of [facility] on 12/11/2023. This over-read does not include interpretation of cardiac or coronary anatomy or pathology. The coronary calcium score/coronary CTA interpretation by the cardiologist is attached.  COMPARISON:  November 13, 2023  FINDINGS: Cardiovascular: There are no significant extracardiac vascular findings.  Mediastinum/Nodes: There are no enlarged lymph nodes within the visualized mediastinum.  Lungs/Pleura: There is no pleural effusion. The likely benign nodule seen within the right upper lobe on the prior study (stable since 2008) is not included in the field of view and is subsequently limited in evaluation.  Upper abdomen: No significant findings in the visualized upper abdomen.  Musculoskeletal/Chest wall: No chest wall mass or suspicious osseous findings within the visualized chest.  IMPRESSION: No significant extracardiac findings within the visualized chest.   Electronically Signed By: Suzen Dials M.D. On: 12/11/2023 01:37  Narrative CLINICAL DATA:  Chest pain  EXAM: Cardiac/Coronary CTA  TECHNIQUE: A non-contrast, gated CT scan was obtained with axial slices of 2.5 mm through the heart for calcium scoring. Calcium scoring was performed using the Agatston method. A 120 kV prospective, gated, contrast cardiac CT scan was obtained. Gantry rotation speed was 230 msec and collimation was 0.63 mm. Two sublingual  nitroglycerin  tablets (0.8 mg) were given. The 3D data set was reconstructed with motion correction for the best systolic or diastolic phase. Images were analyzed on a dedicated workstation using MPR, MIP, and VRT modes. The patient received 95 cc of contrast.  FINDINGS: Image quality: Excellent.  Noise artifact is: Limited.  Coronary Arteries:  Normal coronary origin.  Right dominance.  Left main: The left main is a large caliber vessel with a normal take off from the left coronary cusp that trifurcates to form a left anterior descending artery, ramus, and a left circumflex artery. There is no plaque or stenosis.  Left anterior descending artery: The proximal LAD contains a mild to moderate mixed density plaque (40-50%). The mid and distal segments are patent. The LAD gives off 2 patent diagonal branches.  Ramus intermedius: Patent with no evidence of plaque or stenosis.  Left circumflex artery: The LCX is non-dominant and patent with no evidence of plaque or stenosis. The LCX gives off 1 patent obtuse marginal branch.  Right coronary artery: The RCA is dominant with normal take off from the right coronary cusp. There is no plaque or stenosis. The RCA terminates as a PDA and right posterolateral branch without evidence of plaque or stenosis.  Right Atrium: Right atrial size is within normal limits.  Right Ventricle: The  right ventricular cavity is within normal limits.  Left Atrium: Left atrial size is normal in size with no left atrial appendage filling defect. Small PFO.  Left Ventricle: The ventricular cavity size is within normal limits.  Pulmonary arteries: Normal in size.  Pulmonary veins: Normal pulmonary venous drainage.  Pericardium: Normal thickness without significant effusion or calcium present.  Cardiac valves: The aortic valve is trileaflet without significant calcification. The mitral valve is thickened without calcifications.  Aorta: Normal  caliber without significant disease.  Extra-cardiac findings: See attached radiology report for non-cardiac structures.  IMPRESSION: 1. Coronary calcium score of 43.8. This was 61st percentile for age-, sex, and race-matched controls.  2. Normal coronary origin with right dominance.  3. Mild to moderate mixed density plaque in the proximal LAD (40-50%).  4. Normal LCX/RCA.  RECOMMENDATIONS: 1. CAD-RADS 3: Moderate stenosis. Consider symptom-guided anti-ischemic pharmacotherapy as well as risk factor modification per guideline directed care. Additional analysis with CT FFR will be submitted.  Darryle Decent, MD  Electronically Signed: By: Darryle Decent M.D. On: 12/03/2023 20:43   CT SCANS  CT CORONARY MORPH W/CTA COR W/SCORE 03/30/2020  Addendum 03/30/2020 10:40 PM ADDENDUM REPORT: 03/30/2020 22:37  CLINICAL DATA:  26F with chest pain  EXAM: Cardiac/Coronary CTA  TECHNIQUE: The patient was scanned on a [employer].  FINDINGS: A 100 kV prospective scan was triggered in the descending thoracic aorta at 111 HU's. Axial non-contrast 3 mm slices were carried out through the heart. The data set was analyzed on a dedicated work station and scored using the Agatson method. Gantry rotation speed was 250 msecs and collimation was .6 mm. No beta blockade and 0.8 mg of sl NTG was given. The 3D data set was reconstructed in 5% intervals of the 35-75 % of the R-R cycle. Phases were analyzed on a dedicated work station using MPR, MIP and VRT modes. The patient received 80 cc of contrast.  Coronary Arteries:  Normal coronary origin.  Right dominance.  RCA is a large dominant artery.  There is no plaque.  Left main is a large artery that gives rise to LAD and LCX arteries.  LAD is a large vessel. There is mixed plaque in the proximal LAD causing mild (25-49%) stenosis. There is calcified plaque in the mid LAD causes minimal (0-24%) stenosis. Noncalcified plaque  in the mid LAD causes minimal (0-24%) stenosis.  LCX is a non-dominant artery that gives rise to one large OM1 branch. There is no plaque.  Other findings:  Left Ventricle: Normal size  Left Atrium: Mild enlargement  Pulmonary Veins: Normal configuration  Right Ventricle: Normal size  Right Atrium: Mild enlargement  Cardiac valves: No calcifications  Thoracic aorta: Normal size  Pulmonary Arteries: Normal size  Systemic Veins: Normal drainage  Pericardium: Normal thickness  IMPRESSION: 1. Coronary calcium score of 19. This was 62nd percentile for age and sex matched control.  2. Nonobstructive CAD  3. Mixed plaque in the proximal LAD causes mild (25-49%) stenosis, calcified plaque in the mid LAD causes minimal (0-24%) stenosis, and noncalcified plaque in the mid LAD causes minimal (0-24%) stenosis.  CAD-RADS 2. Mild non-obstructive CAD (25-49%). Consider non-atherosclerotic causes of chest pain. Consider preventive therapy and risk factor modification.   Electronically Signed By: Lonni Nanas MD On: 03/30/2020 22:37  Narrative EXAM: OVER-READ INTERPRETATION  CT CHEST  The following report is an over-read performed by radiologist Dr. Toribio Aye of [facility] on 03/30/2020. This over-read does not include interpretation of  cardiac or coronary anatomy or pathology. The coronary calcium score/coronary CTA interpretation by the cardiologist is attached.  COMPARISON:  Chest CT 02/06/2006.  FINDINGS: Aortic atherosclerosis. Within the visualized portions of the thorax there are no suspicious appearing pulmonary nodules or masses, there is no acute consolidative airspace disease, no pleural effusions, no pneumothorax and no lymphadenopathy. Visualized portions of the upper abdomen are unremarkable. There are no aggressive appearing lytic or blastic lesions noted in the visualized portions of the skeleton.  IMPRESSION: 1.  Aortic  Atherosclerosis (ICD10-I70.0).  Electronically Signed: By: Toribio Aye M.D. On: 03/30/2020 12:21     ______________________________________________________________________________________________       Current Reported Medications:.    No outpatient medications have been marked as taking for the 01/24/24 encounter (Appointment) with Caylei Sperry D, NP.    Physical Exam:    VS:  There were no vitals taken for this visit.   Wt Readings from Last 3 Encounters:  11/28/23 170 lb 3.2 oz (77.2 kg)  11/13/23 175 lb (79.4 kg)  01/10/23 177 lb (80.3 kg)    GEN: Well nourished, well developed in no acute distress NECK: No JVD; No carotid bruits CARDIAC: ***RRR, no murmurs, rubs, gallops RESPIRATORY:  Clear to auscultation without rales, wheezing or rhonchi  ABDOMEN: Soft, non-tender, non-distended EXTREMITIES:  No edema; No acute deformity     Asessement and Plan:.     ***     Disposition: F/u with ***  Signed, Maisen Klingler D Rishikesh Khachatryan, NP

## 2024-01-24 ENCOUNTER — Ambulatory Visit: Admitting: Cardiology

## 2024-01-24 DIAGNOSIS — R002 Palpitations: Secondary | ICD-10-CM

## 2024-01-24 DIAGNOSIS — R0602 Shortness of breath: Secondary | ICD-10-CM

## 2024-01-24 DIAGNOSIS — M7989 Other specified soft tissue disorders: Secondary | ICD-10-CM

## 2024-01-24 DIAGNOSIS — E782 Mixed hyperlipidemia: Secondary | ICD-10-CM

## 2024-01-24 DIAGNOSIS — I2510 Atherosclerotic heart disease of native coronary artery without angina pectoris: Secondary | ICD-10-CM

## 2024-02-16 ENCOUNTER — Other Ambulatory Visit: Payer: Self-pay | Admitting: Cardiology

## 2024-02-16 DIAGNOSIS — I2510 Atherosclerotic heart disease of native coronary artery without angina pectoris: Secondary | ICD-10-CM

## 2024-02-16 DIAGNOSIS — E782 Mixed hyperlipidemia: Secondary | ICD-10-CM

## 2024-02-18 NOTE — Telephone Encounter (Signed)
 Patient Advocate Encounter   The patient was approved for a [employer] grant that will help cover the cost of repatha  Total amount awarded, 2500.  Effective: 03/21/24-03/20/25   APW:[id number] ERW:[id number] Hmnle:[id number] PI:[id number] [employer] ID: [id number]   Pharmacy provided with approval and processing information.    Sent to [facility]

## 2024-04-10 ENCOUNTER — Ambulatory Visit: Admitting: Cardiology
